# Patient Record
Sex: Male | Born: 1955 | Race: Black or African American | Hispanic: No | Marital: Single | Smoking: Never smoker
Health system: Southern US, Community
[De-identification: ages and names within clinical notes are randomized; demographics above are authoritative.]

## PROBLEM LIST (undated history)

## (undated) DIAGNOSIS — I10 Essential (primary) hypertension: Secondary | ICD-10-CM

## (undated) HISTORY — PX: NO PAST SURGERIES: SHX2092

---

## 2006-10-09 ENCOUNTER — Emergency Department (HOSPITAL_COMMUNITY): Admission: EM | Admit: 2006-10-09 | Discharge: 2006-10-09 | Payer: Self-pay | Admitting: Emergency Medicine

## 2011-03-30 ENCOUNTER — Ambulatory Visit (INDEPENDENT_AMBULATORY_CARE_PROVIDER_SITE_OTHER): Payer: No Typology Code available for payment source | Admitting: Family Medicine

## 2011-03-30 VITALS — BP 142/88 | HR 80 | Temp 98.2°F | Resp 16 | Ht 65.0 in | Wt 213.2 lb

## 2011-03-30 DIAGNOSIS — M79609 Pain in unspecified limb: Secondary | ICD-10-CM

## 2011-03-30 DIAGNOSIS — S76019A Strain of muscle, fascia and tendon of unspecified hip, initial encounter: Secondary | ICD-10-CM

## 2011-03-30 MED ORDER — PREDNISONE 20 MG PO TABS: ORAL_TABLET | ORAL | Status: DC

## 2011-03-30 NOTE — Progress Notes (Signed)
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  The pain is associated with back pain.  No weakness or fever or numbness or radiculopathy.  He has some soreness in the anterior superior iliac area.  O:  Tender anterior superior iliac crest and posterior sacroiliac joint area.  Full leg ROM with normal hip rom and norml hip crossover and no groin swelling.  A:  Groin strain   P:  Pred 20 two daily for 5 days in the morning

## 2014-10-21 ENCOUNTER — Ambulatory Visit (INDEPENDENT_AMBULATORY_CARE_PROVIDER_SITE_OTHER): Payer: Managed Care, Other (non HMO) | Admitting: Family Medicine

## 2014-10-21 VITALS — BP 154/92 | HR 50 | Temp 97.9°F | Resp 16 | Ht 65.0 in | Wt 219.0 lb

## 2014-10-21 DIAGNOSIS — M5440 Lumbago with sciatica, unspecified side: Secondary | ICD-10-CM

## 2014-10-21 DIAGNOSIS — M7711 Lateral epicondylitis, right elbow: Secondary | ICD-10-CM

## 2014-10-21 DIAGNOSIS — M129 Arthropathy, unspecified: Secondary | ICD-10-CM

## 2014-10-21 DIAGNOSIS — F524 Premature ejaculation: Secondary | ICD-10-CM

## 2014-10-21 DIAGNOSIS — M1712 Unilateral primary osteoarthritis, left knee: Secondary | ICD-10-CM

## 2014-10-21 MED ORDER — PREDNISONE 20 MG PO TABS: ORAL_TABLET | ORAL | Status: DC

## 2014-10-21 NOTE — Patient Instructions (Signed)
Tennis Elbow Your caregiver has diagnosed you with a condition often referred to as "tennis elbow." This results from small tears or soreness (inflammation) at the start (origin) of the extensor muscles of the forearm. Although the condition is often called tennis or golfer's elbow, it is caused by any repetitive action performed by your elbow. HOME CARE INSTRUCTIONS  If the condition has been short lived, rest may be the only treatment required. Using your opposite hand or arm to perform the task may help. Even changing your grip may help rest the extremity. These may even prevent the condition from recurring.  Longer standing problems, however, will often be relieved faster by:  Using anti-inflammatory agents.  Applying ice packs for 30 minutes at the end of the working day, at bed time, or when activities are finished.  Your caregiver may also have you wear a splint or sling. This will allow the inflamed tendon to heal. At times, steroid injections aided with a local anesthetic will be required along with splinting for 1 to 2 weeks. Two to three steroid injections will often solve the problem. In some long standing cases, the inflamed tendon does not respond to conservative (non-surgical) therapy. Then surgery may be required to repair it. MAKE SURE YOU:   Understand these instructions.  Will watch your condition.  Will get help right away if you are not doing well or get worse. Document Released: 01/22/2005 Document Revised: 04/16/2011 Document Reviewed: 09/10/2007 ExitCare Patient Information 2015 [employer]. This information is not intended to replace advice given to you by your health care provider. Make sure you discuss any questions you have with your health care provider.  

## 2014-10-21 NOTE — Progress Notes (Signed)
This chart was scribed for Elvina Sidle, MD by Stann Ore, medical scribe at [facility].The patient was seen in exam room 10 and the patient's care was started at 12:29 PM.  Patient ID: Curtis Reed MRN: [id number], DOB: 07-May-1955, 59 y.o. Date of Encounter: 10/21/2014  Primary Physician: No primary care provider on file.  Chief Complaint:  Chief Complaint  Patient presents with  . Arm Pain    Onset 2 weeks ago/ forearms/ new pain  . Knee Pain    Onset Chronic several years  . Back Pain    lower/ onset on and off for years    HPI:  Curtis Reed is a 59 y.o. male who presents to [facility] complaining of arm pain in his forearms that started 2 weeks ago.  He notes the pain in both forearms but more in the right than the left. He does lifting at work. He previously had similar symptoms in the past.  He also has left knee pain and intermittent dull back pain for years. When he extends his left knee, there's a crackling sound.   He also notes having premature ejaculation, but doesn't need anything until his wife comes back from [place].  He's from [place]. He works at [employer].   No past medical history on file.   Home Meds: Prior to Admission medications   Medication Sig Start Date End Date Taking? Authorizing Provider  predniSONE (DELTASONE) 20 MG tablet 2 chaques matin pour 5 jours avec petitdejeuners 03/30/11   Elvina Sidle, MD    Allergies: No Known Allergies  Social History   Social History  . Marital Status: Single    Spouse Name: N/A  . Number of Children: N/A  . Years of Education: N/A   Occupational History  . Not on file.   Social History Main Topics  . Smoking status: Never Smoker   . Smokeless tobacco: Not on file  . Alcohol Use: No  . Drug Use: No  . Sexual Activity: Yes   Other Topics Concern  . Not on file   Social History Narrative     Review of Systems: Constitutional:  negative for chills, fever, night sweats, weight changes, or fatigue  HEENT: negative for vision changes, hearing loss, congestion, rhinorrhea, ST, epistaxis, or sinus pressure Cardiovascular: negative for chest pain or palpitations Respiratory: negative for hemoptysis, wheezing, shortness of breath, or cough Abdominal: negative for abdominal pain, nausea, vomiting, diarrhea, or constipation Dermatological: negative for rash Neurologic: negative for headache, dizziness, or syncope Musc: positive for arm pain (both forearms), knee pain (left knee), back pain  GU: positive for premature ejaculation All other systems reviewed and are otherwise negative with the exception to those above and in the HPI.  Physical Exam: Blood pressure 154/92, pulse 50, temperature 97.9 F (36.6 C), temperature source Oral, resp. rate 16, height  (1.651 m), weight 219 lb (99.338 kg), SpO2 98 %., Body mass index is 36.44 kg/(m^2). General: Well developed, well nourished, in no acute distress. Head: Normocephalic, atraumatic, eyes without discharge, sclera non-icteric, nares are without discharge. Bilateral auditory canals clear, TM's are without perforation, pearly grey and translucent with reflective cone of light bilaterally. Oral cavity moist, posterior pharynx without exudate, erythema, peritonsillar abscess, or post nasal drip.  Neck: Supple. No thyromegaly. Full ROM. No lymphadenopathy. Lungs: Clear bilaterally to auscultation without wheezes, rales, or rhonchi. Breathing is unlabored. Heart: RRR with S1 S2. No murmurs, rubs, or  gallops appreciated. Abdomen: Soft, non-tender, non-distended with normoactive bowel sounds. No hepatomegaly. No rebound/guarding. No obvious abdominal masses. Msk:  Strength and tone normal for age; mild tenderness in brachioradialis with slight swelling but no ecchymosis or deformity..  He has crepitus in the left knee with good range of motion and no effusion or bony  abnormality. Extremities/Skin: Warm and dry. No clubbing or cyanosis. No edema. No rashes or suspicious lesions Neuro: Alert and oriented X 3. Moves all extremities spontaneously. Gait is normal. CNII-XII grossly in tact. Psych:  Responds to questions appropriately with a normal affect.     ASSESSMENT AND PLAN:  59 y.o. year old male with recurrent tennis elbow, chronic low back ache (mild), chronic knee crepitus with some pain and premature ejaculation This chart was scribed in my presence and reviewed by me personally.    ICD-9-CM ICD-10-CM   1. Tennis elbow syndrome, right 726.32 M77.11 predniSONE (DELTASONE) 20 MG tablet     DISCONTINUED: predniSONE (DELTASONE) 20 MG tablet  2. Midline low back pain with sciatica, sciatica laterality unspecified 724.3 M54.40 predniSONE (DELTASONE) 20 MG tablet     DISCONTINUED: predniSONE (DELTASONE) 20 MG tablet  3. Arthritis of knee, left 716.96 M12.9 predniSONE (DELTASONE) 20 MG tablet     DISCONTINUED: predniSONE (DELTASONE) 20 MG tablet  4. Premature ejaculation 302.75 F52.4      Signed, Elvina Sidle, MD    Signed, Elvina Sidle, MD 10/21/2014 12:29 PM

## 2016-08-20 ENCOUNTER — Encounter: Payer: Self-pay | Admitting: Physician Assistant

## 2016-08-20 ENCOUNTER — Ambulatory Visit (INDEPENDENT_AMBULATORY_CARE_PROVIDER_SITE_OTHER): Payer: Managed Care, Other (non HMO) | Admitting: Physician Assistant

## 2016-08-20 VITALS — BP 165/99 | HR 71 | Temp 98.1°F | Resp 16 | Ht 65.0 in | Wt 217.0 lb

## 2016-08-20 DIAGNOSIS — I10 Essential (primary) hypertension: Secondary | ICD-10-CM | POA: Diagnosis not present

## 2016-08-20 DIAGNOSIS — Z13220 Encounter for screening for lipoid disorders: Secondary | ICD-10-CM

## 2016-08-20 DIAGNOSIS — F524 Premature ejaculation: Secondary | ICD-10-CM

## 2016-08-20 DIAGNOSIS — M545 Low back pain, unspecified: Secondary | ICD-10-CM

## 2016-08-20 DIAGNOSIS — M170 Bilateral primary osteoarthritis of knee: Secondary | ICD-10-CM

## 2016-08-20 LAB — POCT GLYCOSYLATED HEMOGLOBIN (HGB A1C): Hemoglobin A1C: 5.9

## 2016-08-20 MED ORDER — MELOXICAM 15 MG PO TABS: 7.5000 mg | ORAL_TABLET | Freq: Every day | ORAL | 0 refills | Status: AC

## 2016-08-20 MED ORDER — PAROXETINE HCL 10 MG PO TABS: 5.0000 mg | ORAL_TABLET | Freq: Every day | ORAL | 3 refills | Status: DC

## 2016-08-20 MED ORDER — AMLODIPINE BESYLATE 5 MG PO TABS
5.0000 mg | ORAL_TABLET | Freq: Every day | ORAL | 0 refills | Status: DC
Start: 2016-08-20 — End: 2021-01-27

## 2016-08-20 MED ORDER — PREDNISONE 50 MG PO TABS: ORAL_TABLET | ORAL | 0 refills | Status: DC

## 2016-08-20 NOTE — Patient Instructions (Signed)
     IF you received an x-ray today, you will receive an invoice from [facility]. Please contact [facility] at [phone] with questions or concerns regarding your invoice.   IF you received labwork today, you will receive an invoice from [facility]. Please contact [facility] at [phone] with questions or concerns regarding your invoice.   Our billing staff will not be able to assist you with questions regarding bills from these companies.  You will be contacted with the lab results as soon as they are available. The fastest way to get your results is to activate your My Chart account. Instructions are located on the last page of this paperwork. If you have not heard from us regarding the results in 2 weeks, please contact this office.     

## 2016-08-20 NOTE — Progress Notes (Signed)
08/20/2016 10:26 AM   DOB: 1956/01/25 / MRN: [id number]  SUBJECTIVE:  Curtis Reed is a 61 y.o. male presenting for back pain. The pain has been present for two days now. The pain is left sided and upper lumbar. Denies radicular pain. Denies any difficulty with urination or changes in that. No sudden weight loss.   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  He was unaware that his BP was elevated.   He has No Known Allergies.   He  has no past medical history on file.    He  reports that he has never smoked. He has never used smokeless tobacco. He reports that he does not drink alcohol or use drugs. He  reports that he currently engages in sexual activity. The patient  has no past surgical history on file.  His family history is not on file.  Review of Systems  Constitutional: Negative for chills, diaphoresis and fever.  Gastrointestinal: Negative for nausea.  Skin: Negative for rash.  Neurological: Negative for dizziness.    The problem list and medications were reviewed and updated by myself where necessary and exist elsewhere in the encounter.   OBJECTIVE:  BP (!) 165/99   Pulse 71   Temp 98.1 F (36.7 C) (Oral)   Resp 16   Ht 5' 5" (1.651 m)   Wt 217 lb (98.4 kg)   SpO2 99%   BMI 36.11 kg/m   BP Readings from Last 3 Encounters:  08/20/16 (!) 165/99  10/21/14 (!) 154/92  03/30/11 (!) 142/88   Wt Readings from Last 3 Encounters:  08/20/16 217 lb (98.4 kg)  10/21/14 219 lb (99.3 kg)  03/30/11 213 lb 3.2 oz (96.7 kg)   Physical Exam  Constitutional: He is oriented to person, place, and time. He appears well-developed. He is active and cooperative.  Non-toxic appearance.  Eyes: Pupils are equal, round, and reactive to light. EOM are normal.  Cardiovascular: Normal rate.   Pulmonary/Chest: Effort normal. No tachypnea.  Musculoskeletal:       Legs: Neurological: He is alert and oriented to person, place, and time. He has normal strength and normal reflexes. He is  not disoriented. No cranial nerve deficit or sensory deficit. He exhibits normal muscle tone. Coordination and gait normal.  Skin: Skin is warm and dry. He is not diaphoretic. No pallor.  Psychiatric: His behavior is normal.  Vitals reviewed.   No results found for this or any previous visit (from the past 72 hour(s)).  No results found.  ASSESSMENT AND PLAN:  Charleton was seen today for knee pain and back pain.  Diagnoses and all orders for this visit:  Uncontrolled stage 2 hypertension -     amLODipine (NORVASC) 5 MG tablet; Take 1 tablet (5 mg total) by mouth daily. Come back in mid August 18 for BP recheck. -     POCT glycosylated hemoglobin (Hb A1C) -     CMP14+EGFR -     CBC -     TSH  Acute midline low back pain without sciatica -     meloxicam (MOBIC) 15 MG tablet; Take 0.5-1 tablets (7.5-15 mg total) by mouth daily. Take with food. Do not take Ibuprofen, Goody's, or Aleve while taking this medication.  Screening for lipid disorders -     Lipid panel  Primary osteoarthritis of both knees -     meloxicam (MOBIC) 15 MG tablet; Take 0.5-1 tablets (7.5-15 mg total) by mouth daily. Take with food.  Do not take Ibuprofen, Goody's, or Aleve while taking this medication.  Premature ejaculation, acquired, generalized, mild -     PARoxetine (PAXIL) 10 MG tablet; Take 0.5-1.5 tablets (5-15 mg total) by mouth daily. Take daily at the same time and do not miss doses.  Ideally take 1 hour before sex.    The patient is advised to call or return to clinic if he does not see an improvement in symptoms, or to seek the care of the closest emergency department if he worsens with the above plan.   Philis Fendt, MHS, PA-C [facility] 08/20/2016 10:26 AM

## 2016-08-21 LAB — CMP14+EGFR
ALK PHOS: 55 IU/L (ref 39–117)
ALT: 25 IU/L (ref 0–44)
AST: 28 IU/L (ref 0–40)
Albumin/Globulin Ratio: 1.6 (ref 1.2–2.2)
Albumin: 5 g/dL (ref 3.5–5.5)
BILIRUBIN TOTAL: 0.6 mg/dL (ref 0.0–1.2)
BUN/Creatinine Ratio: 11 (ref 9–20)
BUN: 14 mg/dL (ref 6–24)
CHLORIDE: 101 mmol/L (ref 96–106)
CO2: 18 mmol/L — AB (ref 20–29)
CREATININE: 1.31 mg/dL — AB (ref 0.76–1.27)
Calcium: 9.5 mg/dL (ref 8.7–10.2)
GFR calc Af Amer: 68 mL/min/{1.73_m2} (ref >59)
GFR calc non Af Amer: 59 mL/min/{1.73_m2} — ABNORMAL LOW (ref >59)
GLUCOSE: 84 mg/dL (ref 65–99)
Globulin, Total: 3.1 g/dL (ref 1.5–4.5)
Potassium: 4.5 mmol/L (ref 3.5–5.2)
Sodium: 141 mmol/L (ref 134–144)
Total Protein: 8.1 g/dL (ref 6.0–8.5)

## 2016-08-21 LAB — LIPID PANEL
CHOL/HDL RATIO: 4.9 ratio (ref 0.0–5.0)
CHOLESTEROL TOTAL: 212 mg/dL — AB (ref 100–199)
HDL: 43 mg/dL (ref >39)
LDL CALC: 151 mg/dL — AB (ref 0–99)
Triglycerides: 88 mg/dL (ref 0–149)
VLDL Cholesterol Cal: 18 mg/dL (ref 5–40)

## 2016-08-21 LAB — CBC
HEMATOCRIT: 47.3 % (ref 37.5–51.0)
HEMOGLOBIN: 16.3 g/dL (ref 13.0–17.7)
MCH: 30.4 pg (ref 26.6–33.0)
MCHC: 34.5 g/dL (ref 31.5–35.7)
MCV: 88 fL (ref 79–97)
Platelets: 202 10*3/uL (ref 150–379)
RBC: 5.37 x10E6/uL (ref 4.14–5.80)
RDW: 15.9 % — ABNORMAL HIGH (ref 12.3–15.4)
WBC: 6.2 10*3/uL (ref 3.4–10.8)

## 2016-08-21 LAB — TSH: TSH: 2.24 u[IU]/mL (ref 0.450–4.500)

## 2016-08-23 ENCOUNTER — Telehealth: Payer: Self-pay | Admitting: Physician Assistant

## 2016-08-23 MED ORDER — ATORVASTATIN CALCIUM 20 MG PO TABS: 20.0000 mg | ORAL_TABLET | Freq: Every day | ORAL | 3 refills | Status: DC

## 2016-08-23 NOTE — Telephone Encounter (Signed)
Call placed to make patient aware of medication, patient verbalized understanding./ S.Adynn Caseres,CMA

## 2016-08-23 NOTE — Progress Notes (Signed)
We will recheck his creatinine at follow up. Deliah BostonMichael Clark, MS, PA-C 1:35 PM, 08/23/2016

## 2016-08-23 NOTE — Telephone Encounter (Signed)
Please give him a call and make him aware that I am starting him on a cholesterol medication to protect his heart. This is waiting for him at the pharmacy. Deliah BostonMichael Clark, MS, PA-C 1:33 PM, 08/23/2016

## 2016-08-23 NOTE — Telephone Encounter (Signed)
-----   Message from [facility] M [place], [place] sent at 08/23/2016  8:29 AM EDT ----- Patient has ASCVD Risk factor of 21.3%.

## 2016-09-24 ENCOUNTER — Ambulatory Visit: Payer: Managed Care, Other (non HMO) | Admitting: Physician Assistant

## 2020-10-19 ENCOUNTER — Emergency Department (HOSPITAL_COMMUNITY)
Admission: EM | Admit: 2020-10-19 | Discharge: 2020-10-19 | Disposition: A | Discharge status: Home / Self Care | Payer: Managed Care, Other (non HMO) | Attending: Emergency Medicine | Admitting: Emergency Medicine

## 2020-10-19 ENCOUNTER — Encounter (HOSPITAL_COMMUNITY): Payer: Self-pay | Admitting: Emergency Medicine

## 2020-10-19 ENCOUNTER — Emergency Department (HOSPITAL_COMMUNITY): Payer: Managed Care, Other (non HMO)

## 2020-10-19 DIAGNOSIS — Z79899 Other long term (current) drug therapy: Secondary | ICD-10-CM | POA: Diagnosis not present

## 2020-10-19 DIAGNOSIS — D72829 Elevated white blood cell count, unspecified: Secondary | ICD-10-CM | POA: Diagnosis not present

## 2020-10-19 DIAGNOSIS — E871 Hypo-osmolality and hyponatremia: Secondary | ICD-10-CM | POA: Insufficient documentation

## 2020-10-19 DIAGNOSIS — E878 Other disorders of electrolyte and fluid balance, not elsewhere classified: Secondary | ICD-10-CM | POA: Diagnosis not present

## 2020-10-19 DIAGNOSIS — N400 Enlarged prostate without lower urinary tract symptoms: Secondary | ICD-10-CM | POA: Insufficient documentation

## 2020-10-19 DIAGNOSIS — I10 Essential (primary) hypertension: Secondary | ICD-10-CM | POA: Diagnosis not present

## 2020-10-19 DIAGNOSIS — R339 Retention of urine, unspecified: Secondary | ICD-10-CM | POA: Diagnosis present

## 2020-10-19 LAB — COMPREHENSIVE METABOLIC PANEL
ALT: 38 U/L (ref 0–44)
AST: 48 U/L — ABNORMAL HIGH (ref 15–41)
Albumin: 3.8 g/dL (ref 3.5–5.0)
Alkaline Phosphatase: 39 U/L (ref 38–126)
Anion gap: 12 (ref 5–15)
BUN: 33 mg/dL — ABNORMAL HIGH (ref 8–23)
CO2: 24 mmol/L (ref 22–32)
Calcium: 8.6 mg/dL — ABNORMAL LOW (ref 8.9–10.3)
Chloride: 94 mmol/L — ABNORMAL LOW (ref 98–111)
Creatinine, Ser: 1.78 mg/dL — ABNORMAL HIGH (ref 0.61–1.24)
GFR, Estimated: 42 mL/min — ABNORMAL LOW (ref >60)
Glucose, Bld: 134 mg/dL — ABNORMAL HIGH (ref 70–99)
Potassium: 3.7 mmol/L (ref 3.5–5.1)
Sodium: 130 mmol/L — ABNORMAL LOW (ref 135–145)
Total Bilirubin: 1.5 mg/dL — ABNORMAL HIGH (ref 0.3–1.2)
Total Protein: 7.4 g/dL (ref 6.5–8.1)

## 2020-10-19 LAB — CBC WITH DIFFERENTIAL/PLATELET
Abs Immature Granulocytes: 0.06 10*3/uL (ref 0.00–0.07)
Basophils Absolute: 0 10*3/uL (ref 0.0–0.1)
Basophils Relative: 0 %
Eosinophils Absolute: 0.1 10*3/uL (ref 0.0–0.5)
Eosinophils Relative: 1 %
HCT: 41.6 % (ref 39.0–52.0)
Hemoglobin: 14.2 g/dL (ref 13.0–17.0)
Immature Granulocytes: 0 %
Lymphocytes Relative: 9 %
Lymphs Abs: 1.3 10*3/uL (ref 0.7–4.0)
MCH: 30.4 pg (ref 26.0–34.0)
MCHC: 34.1 g/dL (ref 30.0–36.0)
MCV: 89.1 fL (ref 80.0–100.0)
Monocytes Absolute: 1.6 10*3/uL — ABNORMAL HIGH (ref 0.1–1.0)
Monocytes Relative: 12 %
Neutro Abs: 10.8 10*3/uL — ABNORMAL HIGH (ref 1.7–7.7)
Neutrophils Relative %: 78 %
Platelets: 183 10*3/uL (ref 150–400)
RBC: 4.67 MIL/uL (ref 4.22–5.81)
RDW: 13.2 % (ref 11.5–15.5)
WBC: 13.9 10*3/uL — ABNORMAL HIGH (ref 4.0–10.5)
nRBC: 0 % (ref 0.0–0.2)

## 2020-10-19 LAB — URINALYSIS, ROUTINE W REFLEX MICROSCOPIC
Bilirubin Urine: NEGATIVE
Glucose, UA: NEGATIVE mg/dL
Ketones, ur: NEGATIVE mg/dL
Leukocytes,Ua: NEGATIVE
Nitrite: NEGATIVE
Protein, ur: 100 mg/dL — AB
Specific Gravity, Urine: 1.005 (ref 1.005–1.030)
pH: 6 (ref 5.0–8.0)

## 2020-10-19 NOTE — ED Provider Notes (Signed)
Emergency Medicine Provider Triage Evaluation Note  Curtis Reed , a 65 y.o. male  was evaluated in triage.  Pt complains of hematuria, dysuria, and right-sided flank pain.  Patient reports that his symptoms started yesterday evening.  Symptoms have been present since then.  Patient endorses urinary urgency and frequency.    Review of Systems  Positive: Dysuria, hematuria, right-sided flank pain, urinary urgency, urinary frequency, Negative: Fever, chills, abdominal pain, nausea, vomiting  Physical Exam  BP (!) 165/100 (BP Location: Right Arm)   Pulse 95   Temp 99.5 F (37.5 C) (Oral)   Resp 18   SpO2 100%  Gen:   Awake, no distress   Resp:  Normal effort  MSK:   Moves extremities without difficulty  Other:  Abdomen protuberant, soft, nondistended, nontender.  Positive right CVA tenderness.  Medical Decision Making  Medically screening exam initiated at 1:37 PM.  Appropriate orders placed.  Rishawn Walck was informed that the remainder of the evaluation will be completed by another provider, this initial triage assessment does not replace that evaluation, and the importance of remaining in the ED until their evaluation is complete.  The patient appears stable so that the remainder of the work up may be completed by another provider.      Haskel Schroeder, PA-C 10/19/20 1338    Blane Ohara, MD 10/20/20 [phone]

## 2020-10-19 NOTE — ED Provider Notes (Addendum)
[facility] EMERGENCY DEPARTMENT Provider Note   CSN: [id number] Arrival date & time: 10/19/20  1232     History Chief Complaint  Patient presents with   Hematuria    Curtis Reed is a 65 y.o. male.  HPI Patient is a 65 year old male with past medical history significant for HTN states he has not been seen by urologist has no known history of BPH he is presented to the ER today with complaints of hematuria that he states noted the first noticed yesterday.  He states that he has had an issue with dribbling, urinary incontinence and retention since November 2020.  He states that he was never seen by urology for this.  He states that he has a primary care provider however cannot give me the name of his practitioner.  He denies any fevers chills nausea vomiting abdominal pain chest pain or shortness of breath but states that his abdomen feels very full.  No lightheadedness or dizziness no other associated symptoms.     History reviewed. No pertinent past medical history.  Patient Active Problem List   Diagnosis Date Noted   Uncontrolled stage 2 hypertension 08/20/2016    No past surgical history on file.     No family history on file.  Social History   Tobacco Use   Smoking status: Never   Smokeless tobacco: Never  Vaping Use   Vaping Use: Never used  Substance Use Topics   Alcohol use: No   Drug use: No    Home Medications Prior to Admission medications   Medication Sig Start Date End Date Taking? Authorizing Provider  amLODipine (NORVASC) 5 MG tablet Take 1 tablet (5 mg total) by mouth daily. Come back in mid August 18 for BP recheck. 08/20/16   Ofilia Neas, PA-C  atorvastatin (LIPITOR) 20 MG tablet Take 1 tablet (20 mg total) by mouth daily. 08/23/16   Ofilia Neas, PA-C  PARoxetine (PAXIL) 10 MG tablet Take 0.5-1.5 tablets (5-15 mg total) by mouth daily. Take daily at the same time and do not miss doses.  Ideally take 1 hour before sex.  08/20/16   Ofilia Neas, PA-C  predniSONE (DELTASONE) 50 MG tablet Take one daily for three days.  After that start taking meloxicam daily as needed for arthritis and or back pain. 08/20/16   Ofilia Neas, PA-C    Allergies    Patient has no known allergies.  Review of Systems   Review of Systems  Constitutional:  Negative for chills and fever.  HENT:  Negative for congestion.   Eyes:  Negative for pain.  Respiratory:  Negative for cough and shortness of breath.   Cardiovascular:  Negative for chest pain and leg swelling.  Gastrointestinal:  Negative for abdominal pain and vomiting.  Genitourinary:  Positive for hematuria. Negative for dysuria.  Musculoskeletal:  Negative for myalgias.  Skin:  Negative for rash.  Neurological:  Negative for dizziness and headaches.   Physical Exam Updated Vital Signs BP (!) 159/78   Pulse 71   Temp 99.5 F (37.5 C) (Oral)   Resp 17   SpO2 99%   Physical Exam Vitals and nursing note reviewed.  Constitutional:      General: He is not in acute distress.    Appearance: He is obese.     Comments: Abuse 65 year old male in no acute distress but somewhat uncomfortable appearing  HENT:     Head: Normocephalic and atraumatic.     Nose: Nose normal.  Eyes:     General: No scleral icterus. Cardiovascular:     Rate and Rhythm: Normal rate and regular rhythm.     Pulses: Normal pulses.     Heart sounds: Normal heart sounds.  Pulmonary:     Effort: Pulmonary effort is normal. No respiratory distress.     Breath sounds: No wheezing.  Abdominal:     Palpations: Abdomen is soft.     Tenderness: There is abdominal tenderness. There is no guarding or rebound.     Comments: Some suprapubic tenderness  Musculoskeletal:     Cervical back: Normal range of motion.     Right lower leg: No edema.     Left lower leg: No edema.  Skin:    General: Skin is warm and dry.     Capillary Refill: Capillary refill takes less than 2 seconds.  Neurological:      Mental Status: He is alert. Mental status is at baseline.  Psychiatric:        Mood and Affect: Mood normal.        Behavior: Behavior normal.    ED Results / Procedures / Treatments   Labs (all labs ordered are listed, but only abnormal results are displayed) Labs Reviewed  URINALYSIS, ROUTINE W REFLEX MICROSCOPIC - Abnormal; Notable for the following components:      Result Value   Color, Urine AMBER (*)    APPearance HAZY (*)    Hgb urine dipstick MODERATE (*)    Protein, ur 100 (*)    Bacteria, UA RARE (*)    All other components within normal limits  CBC WITH DIFFERENTIAL/PLATELET - Abnormal; Notable for the following components:   WBC 13.9 (*)    Neutro Abs 10.8 (*)    Monocytes Absolute 1.6 (*)    All other components within normal limits  COMPREHENSIVE METABOLIC PANEL - Abnormal; Notable for the following components:   Sodium 130 (*)    Chloride 94 (*)    Glucose, Bld 134 (*)    BUN 33 (*)    Creatinine, Ser 1.78 (*)    Calcium 8.6 (*)    AST 48 (*)    Total Bilirubin 1.5 (*)    GFR, Estimated 42 (*)    All other components within normal limits  URINE CULTURE    EKG None  Radiology CT Renal Stone Study  Result Date: 10/19/2020 CLINICAL DATA:  Flank pain, hematuria EXAM: CT ABDOMEN AND PELVIS WITHOUT CONTRAST TECHNIQUE: Multidetector CT imaging of the abdomen and pelvis was performed following the standard protocol without IV contrast. COMPARISON:  None. FINDINGS: Lower chest: The lung bases are clear. The imaged heart is unremarkable. Hepatobiliary: The liver and gallbladder are unremarkable. There is no biliary ductal dilatation. Pancreas: Unremarkable. Spleen: Unremarkable. Adrenals/Urinary Tract: The adrenals are unremarkable. The bladder is markedly distended due to a markedly enlarged prostate. There is mild to moderate bilateral hydroureter and mild hydronephrosis. A fluid density structure abutting the anterior margin of the left psoas muscle and  posterior aspect of the bladder is favored to reflect a tortuous left ureter. No focal lesions or calculi are seen in either kidney or along the course of either ureter. Stomach/Bowel: The stomach is unremarkable. There is no evidence of bowel obstruction. There is no abnormal bowel wall thickening or inflammatory change. The appendix is unremarkable. Vascular/Lymphatic: There is calcified atherosclerotic plaque in the bilateral iliac vessels. The aorta is nonaneurysmal. There is no abdominal or pelvic lymphadenopathy. Reproductive: The prostate is markedly enlarged  measuring up to 7.8 cm TV x 7.3 cm AP by 7.3 cm cc yielding a calculated volume of 272 cc Other: There is scattered small volume free fluid in the pericolic gutters. There is no free intraperitoneal air. Musculoskeletal: There is mild multilevel degenerative change of the spine. There is no acute osseous abnormality or aggressive osseous lesion. 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 No stone is identified. Electronically Signed   By: Lesia Hausen M.D.   On: 10/19/2020 14:53    Procedures Procedures   Medications Ordered in ED Medications - No data to display  ED Course  I have reviewed the triage vital signs and the nursing notes.  Pertinent labs & imaging results that were available during my care of the patient were reviewed by me and considered in my medical decision making (see chart for details).    MDM Rules/Calculators/A&P                           Patient is 65 year old gentleman seems to not have a PCP here today with some abdominal pressure and discomfort hematuria for few days before this seems to have had urinary hesitancy and dribbling for the past 2 years.  No infectious symptoms no fevers chills CMP is notable for mild hyponatremia mild hypochloremia BUN is marginally elevated  from his baseline at 1.3 today with creatinine of 1.78 BUN is elevated consistent with post renal/obstructive uropathy.  Patient had immediate improvement in his symptoms after Foley was placed just short of 1 L of bloody urine output  CBC with mild leukocytosis patient denies any fevers or chills he is well-appearing afebrile no tachycardia blood pressure moderately elevated urinalysis with rare bacteria we will culture urine  Ultimately patient will be discharged home with Foley catheter in place lengthy discussion about the importance of close follow-up with PCP to recheck kidney function if not able to follow-up will need to return to ER in 2 or 3 days for kidney function recheck.  We will follow-up with urology.  I provide patient with information for their office and request that he call today to make an appointment as soon as possible.  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 No stone is identified. Electronically Signed   By: Lesia Hausen M.D.   On: 10/19/2020 14:53    Return precautions given.  Patient discharged with Foley catheter in place we will hold off on prescribing antibiotics given unremarkable urine appearance Culture sent and pending.    Final Clinical Impression(s) / ED Diagnoses Final diagnoses:  Urinary retention  Enlarged prostate    Rx / DC Orders ED Discharge Orders     None        Gailen Shelter, [place] 10/20/20 1738    Gailen Shelter, [place] 10/20/20 1741    Gerhard Munch, MD 10/20/20 2330

## 2020-10-19 NOTE — Discharge Instructions (Addendum)
You had a Foley catheter placed today and were found to have a good amount of blood in your urine.  You will need to follow-up with urology.  They may need to do further evaluations of you for your blood in urine/hematuria.  This may or may not include using a camera to inspect your bladder.  Please take your prescribed medications as prescribed.  Please leave the Foley in place until you are seen by urology.  You may always return to the ER for any new or concerning symptoms.  Your prostate is quite large I suspect that this is causing the obstruction of your urinary canal.

## 2020-10-19 NOTE — ED Notes (Signed)
Foley insert and of dark brown urine obtained.

## 2020-10-19 NOTE — ED Triage Notes (Signed)
Patient complains of hematuria that started yesterday. States he has had a problem with intermittent urinary incontinence and retention since November 2020.

## 2020-10-20 LAB — URINE CULTURE: Culture: NO GROWTH

## 2020-10-28 ENCOUNTER — Other Ambulatory Visit: Payer: Self-pay

## 2020-10-28 ENCOUNTER — Emergency Department (HOSPITAL_COMMUNITY)
Admission: EM | Admit: 2020-10-28 | Discharge: 2020-10-28 | Disposition: A | Discharge status: Home / Self Care | Payer: Managed Care, Other (non HMO) | Attending: Emergency Medicine | Admitting: Emergency Medicine

## 2020-10-28 ENCOUNTER — Encounter (HOSPITAL_COMMUNITY): Payer: Self-pay

## 2020-10-28 DIAGNOSIS — R319 Hematuria, unspecified: Secondary | ICD-10-CM | POA: Diagnosis present

## 2020-10-28 DIAGNOSIS — R310 Gross hematuria: Secondary | ICD-10-CM

## 2020-10-28 LAB — CBC
HCT: 39 % (ref 39.0–52.0)
Hemoglobin: 13.3 g/dL (ref 13.0–17.0)
MCH: 30.9 pg (ref 26.0–34.0)
MCHC: 34.1 g/dL (ref 30.0–36.0)
MCV: 90.7 fL (ref 80.0–100.0)
Platelets: 311 10*3/uL (ref 150–400)
RBC: 4.3 MIL/uL (ref 4.22–5.81)
RDW: 13.2 % (ref 11.5–15.5)
WBC: 7.5 10*3/uL (ref 4.0–10.5)
nRBC: 0 % (ref 0.0–0.2)

## 2020-10-28 LAB — BASIC METABOLIC PANEL
Anion gap: 6 (ref 5–15)
BUN: 9 mg/dL (ref 8–23)
CO2: 25 mmol/L (ref 22–32)
Calcium: 8.4 mg/dL — ABNORMAL LOW (ref 8.9–10.3)
Chloride: 104 mmol/L (ref 98–111)
Creatinine, Ser: 1.12 mg/dL (ref 0.61–1.24)
GFR, Estimated: >60 mL/min (ref >60)
Glucose, Bld: 162 mg/dL — ABNORMAL HIGH (ref 70–99)
Potassium: 3.2 mmol/L — ABNORMAL LOW (ref 3.5–5.1)
Sodium: 135 mmol/L (ref 135–145)

## 2020-10-28 LAB — URINALYSIS, MICROSCOPIC (REFLEX)
RBC / HPF: >50 RBC/hpf (ref 0–5)
Squamous Epithelial / HPF: NONE SEEN (ref 0–5)

## 2020-10-28 LAB — URINALYSIS, ROUTINE W REFLEX MICROSCOPIC

## 2020-10-28 NOTE — ED Triage Notes (Signed)
Pt c.o hematuria since yesterday. Seen about 2 weeks ago for urinary retention and had urinary catheter placed. Leg bag currently filled with dark red urine. Pt denies any pain, appointment scheduled with urologist next week.

## 2020-10-28 NOTE — ED Provider Notes (Signed)
[facility] EMERGENCY DEPARTMENT Provider Note   CSN: [id number] Arrival date & time: 10/28/20  1508     History Chief Complaint  Patient presents with   Hematuria    Curtis Reed is a 65 y.o. male.  65 year old male who had a urinary catheter placed about a week ago the presents emerged from today for hematuria.  Patient states that he did not have any issues until yesterday.  He states that he feels little bit of rubbing and scratching on his thigh and in his penis and in his bladder.  No obvious trauma that preceded the event.  He has no pain elsewhere.  He has no back pain.  No fever, nausea, vomiting.  States that he is on 2 medicines once an antibiotic and it sounds like the other is Flomax.  His scheduled to see urology again in a about a week.   Hematuria      History reviewed. No pertinent past medical history.  Patient Active Problem List   Diagnosis Date Noted   Uncontrolled stage 2 hypertension 08/20/2016    History reviewed. No pertinent surgical history.     No family history on file.  Social History   Tobacco Use   Smoking status: Never   Smokeless tobacco: Never  Vaping Use   Vaping Use: Never used  Substance Use Topics   Alcohol use: No   Drug use: No    Home Medications Prior to Admission medications   Medication Sig Start Date End Date Taking? Authorizing Provider  amLODipine (NORVASC) 5 MG tablet Take 1 tablet (5 mg total) by mouth daily. Come back in mid August 18 for BP recheck. 08/20/16   Ofilia Neas, PA-C  atorvastatin (LIPITOR) 20 MG tablet Take 1 tablet (20 mg total) by mouth daily. 08/23/16   Ofilia Neas, PA-C  PARoxetine (PAXIL) 10 MG tablet Take 0.5-1.5 tablets (5-15 mg total) by mouth daily. Take daily at the same time and do not miss doses.  Ideally take 1 hour before sex. 08/20/16   Ofilia Neas, PA-C  predniSONE (DELTASONE) 50 MG tablet Take one daily for three days.  After that start taking meloxicam  daily as needed for arthritis and or back pain. 08/20/16   Ofilia Neas, PA-C    Allergies    Patient has no known allergies.  Review of Systems   Review of Systems  Genitourinary:  Positive for hematuria.  All other systems reviewed and are negative.  Physical Exam Updated Vital Signs BP 136/80   Pulse (!) 54   Temp 98 F (36.7 C)   Resp 16   Ht 5\' 5"  (1.651 m)   Wt 98.4 kg   SpO2 100%   BMI 36.10 kg/m   Physical Exam Vitals and nursing note reviewed.  Constitutional:      Appearance: He is well-developed.  HENT:     Head: Normocephalic and atraumatic.     Nose: No congestion or rhinorrhea.  Eyes:     Pupils: Pupils are equal, round, and reactive to light.  Cardiovascular:     Rate and Rhythm: Normal rate.  Pulmonary:     Effort: Pulmonary effort is normal. No respiratory distress.  Abdominal:     General: Abdomen is flat. There is no distension.     Tenderness: There is no abdominal tenderness.     Comments: Leg bag in place with pretty dark hematuria present. Catheter is taught.   Musculoskeletal:  General: Normal range of motion.     Cervical back: Normal range of motion.  Skin:    General: Skin is warm and dry.  Neurological:     General: No focal deficit present.     Mental Status: He is alert.    ED Results / Procedures / Treatments   Labs (all labs ordered are listed, but only abnormal results are displayed) Labs Reviewed  BASIC METABOLIC PANEL - Abnormal; Notable for the following components:      Result Value   Potassium 3.2 (*)    Glucose, Bld 162 (*)    Calcium 8.4 (*)    All other components within normal limits  URINALYSIS, ROUTINE W REFLEX MICROSCOPIC - Abnormal; Notable for the following components:   Color, Urine RED (*)    APPearance TURBID (*)    Glucose, UA   (*)    Value: TEST NOT REPORTED DUE TO COLOR INTERFERENCE OF URINE PIGMENT   Hgb urine dipstick   (*)    Value: TEST NOT REPORTED DUE TO COLOR INTERFERENCE OF URINE  PIGMENT   Bilirubin Urine   (*)    Value: TEST NOT REPORTED DUE TO COLOR INTERFERENCE OF URINE PIGMENT   Ketones, ur   (*)    Value: TEST NOT REPORTED DUE TO COLOR INTERFERENCE OF URINE PIGMENT   Protein, ur   (*)    Value: TEST NOT REPORTED DUE TO COLOR INTERFERENCE OF URINE PIGMENT   Nitrite   (*)    Value: TEST NOT REPORTED DUE TO COLOR INTERFERENCE OF URINE PIGMENT   Leukocytes,Ua   (*)    Value: TEST NOT REPORTED DUE TO COLOR INTERFERENCE OF URINE PIGMENT   All other components within normal limits  URINALYSIS, MICROSCOPIC (REFLEX) - Abnormal; Notable for the following components:   Bacteria, UA MANY (*)    All other components within normal limits  CBC    EKG None  Radiology No results found.  Procedures Procedures   Medications Ordered in ED Medications - No data to display  ED Course  I have reviewed the triage vital signs and the nursing notes.  Pertinent labs & imaging results that were available during my care of the patient were reviewed by me and considered in my medical decision making (see chart for details).    MDM Rules/Calculators/A&P                         Patient's urine is difficult to interpret with amount of hematuria.  He does have more bacteria than previously however he is currently on an antibiotic.  We will send it for culture.  I suspect he probably has some type of trauma or just from being too tight and he is having some hematuria from that.  Either way the catheter still draining normally and he has an appoint with urology in a week.  I discussed that if he had any stoppage of urine to return here immediately.  Otherwise we will adjust his attachment point for his Foley in hopes that it gives him a little bit more leeway and I discussed the bleeding to stop in a couple days.  Final Clinical Impression(s) / ED Diagnoses Final diagnoses:  None    Rx / DC Orders ED Discharge Orders     None        Tysheem Accardo, Barbara Cower, MD 10/28/20 2304

## 2020-10-28 NOTE — ED Provider Notes (Signed)
Emergency Medicine Provider Triage Evaluation Note  Curtis Reed , a 65 y.o. male  was evaluated in triage.  Pt complains of hematuria.  Symptoms began yesterday.  He was seen here about 2 weeks ago for urinary retention, abdominal pain, headache catheter placed.  Patient states that his urine was very dark when he first came to the ER on 9/14.  He had a CT scan which showed a markedly enlarged prostate, he had a Foley placed and was told to follow-up with his PCP.  Patient states he has a urology appointment on Monday.  He states that his urine has become more clear but yesterday began more bloody.  He denies any abdominal back, penile pain. Review of Systems  Positive: As above Negative: As above  Physical Exam  BP (!) 158/91 (BP Location: Left Arm)   Pulse 80   Temp 98 F (36.7 C)   Resp 18   Ht 5\' 5"  (1.651 m)   Wt 98.4 kg   SpO2 100%   BMI 36.10 kg/m  Gen:   Awake, no distress   Resp:  Normal effort  MSK:   Moves extremities without difficulty  Other:  GU exam with no visible bleeding around the urinary catheter, scrotum soft and nontender, abdomen is soft and nontender  Medical Decision Making  Medically screening exam initiated at 3:32 PM.  Appropriate orders placed.  Curtis Reed was informed that the remainder of the evaluation will be completed by another provider, this initial triage assessment does not replace that evaluation, and the importance of remaining in the ED until their evaluation is complete.     Ernie Hew, PA-C 10/28/20 1535    10/30/20 P, DO 10/30/20 1152

## 2020-10-31 LAB — URINE CULTURE: Culture: >=100000 — AB

## 2020-11-01 ENCOUNTER — Telehealth: Payer: Self-pay

## 2020-11-01 NOTE — Telephone Encounter (Signed)
Post ED Visit - Positive Culture Follow-up  Culture report reviewed by antimicrobial stewardship pharmacist: [facility] [x]  , Pharm.D. []  Filbert Schilder, Pharm.D., BCPS AQ-ID []  , Pharm.D., BCPS []  Celedonio Miyamoto, Pharm.D., BCPS []  [place], Garvin Fila.D., BCPS, AAHIVP []  , Pharm.D., BCPS, AAHIVP []  Georgina Pillion, PharmD, BCPS []  , PharmD, BCPS []  [place], PharmD, BCPS []  [place], PharmD []  , PharmD, BCPS []  Estella Husk, PharmD  Pharmacy Team []  Lysle Pearl, PharmD []  , PharmD []  Phillips Climes, PharmD []  , Rph []  Agapito Games) , PharmD []  Verlan Friends, PharmD []  , PharmD []  Mervyn Gay, PharmD []  , PharmD []  Vinnie Level, PharmD []  Wonda Olds, PharmD []  , PharmD []  Len Childs, PharmD   Positive urine culture Reviewed by ED provider: , PA-C  Confirmed with patient that he has a follow-up appointment with his urologist on Friday, 11/04/2020 at 10:45 am.   no further patient follow-up is required at this time.  11/01/2020, 9:15 AM

## 2020-11-01 NOTE — Progress Notes (Signed)
ED Antimicrobial Stewardship Positive Culture Follow Up   Curtis Reed is an 65 y.o. male who presented to [facility] on 10/28/2020 with a chief complaint of  Chief Complaint  Patient presents with   Hematuria    Recent Results (from the past 720 hour(s))  Urine Culture     Status: None   Collection Time: 10/19/20  1:35 PM   Specimen: Urine, Clean Catch  Result Value Ref Range Status   Specimen Description URINE, CLEAN CATCH  Final   Special Requests NONE  Final   Culture   Final    NO GROWTH Performed at [facility], [address] [place], [place] [address]    Report Status 10/20/2020 FINAL  Final  Urine Culture     Status: Abnormal   Collection Time: 10/28/20  8:00 PM   Specimen: Urine, Catheterized  Result Value Ref Range Status   Specimen Description URINE, CATHETERIZED  Final   Special Requests   Final    NONE Performed at [facility], [address], [place], [place] [address]    Culture >=100,000 COLONIES/mL KLEBSIELLA OXYTOCA (A)  Final   Report Status 10/31/2020 FINAL  Final   Organism ID, Bacteria KLEBSIELLA OXYTOCA (A)  Final      Susceptibility   Klebsiella oxytoca - MIC*    AMPICILLIN RESISTANT Resistant     CEFAZOLIN <=4 SENSITIVE Sensitive     CEFEPIME <=0.12 SENSITIVE Sensitive     CEFTRIAXONE <=0.25 SENSITIVE Sensitive     CIPROFLOXACIN <=0.25 SENSITIVE Sensitive     GENTAMICIN <=1 SENSITIVE Sensitive     IMIPENEM <=0.25 SENSITIVE Sensitive     NITROFURANTOIN <=16 SENSITIVE Sensitive     TRIMETH/SULFA <=20 SENSITIVE Sensitive     AMPICILLIN/SULBACTAM 4 SENSITIVE Sensitive     PIP/TAZO <=4 SENSITIVE Sensitive     * >=100,000 COLONIES/mL KLEBSIELLA OXYTOCA    65 yo male presents with hematuria and urinary retention in the setting of indwelling catheter. UA significant for WBC. Not discharged with antibiotics since he was scheduled to see urology in the next few days.  Nursing to call and confirm that patient has urology follow-up and  symptoms have resolved.   ED Provider: Fayrene Helper, PA-C   [name] 11/01/2020, 7:48 AM Clinical Pharmacist Monday - Friday phone -  [phone] Saturday - Sunday phone - [phone]

## 2020-11-07 ENCOUNTER — Emergency Department (HOSPITAL_COMMUNITY)
Admission: EM | Admit: 2020-11-07 | Discharge: 2020-11-08 | Disposition: A | Discharge status: Home / Self Care | Payer: Managed Care, Other (non HMO) | Attending: Emergency Medicine | Admitting: Emergency Medicine

## 2020-11-07 DIAGNOSIS — T839XXA Unspecified complication of genitourinary prosthetic device, implant and graft, initial encounter: Secondary | ICD-10-CM

## 2020-11-07 DIAGNOSIS — T83098A Other mechanical complication of other indwelling urethral catheter, initial encounter: Secondary | ICD-10-CM | POA: Diagnosis present

## 2020-11-07 DIAGNOSIS — I10 Essential (primary) hypertension: Secondary | ICD-10-CM | POA: Diagnosis not present

## 2020-11-07 DIAGNOSIS — Z79899 Other long term (current) drug therapy: Secondary | ICD-10-CM | POA: Diagnosis not present

## 2020-11-07 DIAGNOSIS — R1030 Lower abdominal pain, unspecified: Secondary | ICD-10-CM | POA: Insufficient documentation

## 2020-11-07 LAB — URINALYSIS, ROUTINE W REFLEX MICROSCOPIC
Bilirubin Urine: NEGATIVE
Glucose, UA: NEGATIVE mg/dL
Ketones, ur: 20 mg/dL — AB
Nitrite: NEGATIVE
Protein, ur: 100 mg/dL — AB
RBC / HPF: >50 RBC/hpf — ABNORMAL HIGH (ref 0–5)
Specific Gravity, Urine: 1.018 (ref 1.005–1.030)
pH: 6 (ref 5.0–8.0)

## 2020-11-07 MED ORDER — CEPHALEXIN 250 MG PO CAPS
500.0000 mg | ORAL_CAPSULE | Freq: Once | ORAL | Status: AC
  Administered 2020-11-07: 500 mg via ORAL
  Filled 2020-11-07: qty 2

## 2020-11-07 MED ORDER — OXYCODONE-ACETAMINOPHEN 5-325 MG PO TABS
1.0000 | ORAL_TABLET | Freq: Once | ORAL | Status: AC
  Administered 2020-11-07: 1 via ORAL
  Filled 2020-11-07: qty 1

## 2020-11-07 MED ORDER — LIDOCAINE HCL URETHRAL/MUCOSAL 2 % EX GEL
1.0000 "application " | Freq: Once | CUTANEOUS | Status: AC
  Administered 2020-11-07: 1 via URETHRAL
  Filled 2020-11-07: qty 11

## 2020-11-07 NOTE — ED Provider Notes (Signed)
[facility] EMERGENCY DEPARTMENT Provider Note   CSN: [id number] Arrival date & time: 11/07/20  1120     History Chief Complaint  Patient presents with   Groin Pain    Curtis Reed is a 65 y.o. male.  The history is provided by the patient and medical records.  Groin Pain  65 y.o. M here with pain related to his foley catheter.  Patient seen in the ED last month, had foley placed in the ED.  This was exchanged on Friday in urology office and states since that time he has had ongoing pain along tip of his penis.  He states there has been fluids draining around the foley itself which is new.  Foley has continued draining as normal.  No fever/chills/sweats.  States foley is supposed to stay in place until 12-26-20.  States he is having  "surgery" then.  No past medical history on file.  Patient Active Problem List   Diagnosis Date Noted   Uncontrolled stage 2 hypertension 08/20/2016    No past surgical history on file.     No family history on file.  Social History   Tobacco Use   Smoking status: Never   Smokeless tobacco: Never  Vaping Use   Vaping Use: Never used  Substance Use Topics   Alcohol use: No   Drug use: No    Home Medications Prior to Admission medications   Medication Sig Start Date End Date Taking? Authorizing Provider  cephALEXin (KEFLEX) 500 MG capsule Take 1 capsule (500 mg total) by mouth 3 (three) times daily. 11/08/20  Yes Garlon Hatchet, PA-C  amLODipine (NORVASC) 5 MG tablet Take 1 tablet (5 mg total) by mouth daily. Come back in mid August 18 for BP recheck. 08/20/16   Ofilia Neas, PA-C  atorvastatin (LIPITOR) 20 MG tablet Take 1 tablet (20 mg total) by mouth daily. 08/23/16   Ofilia Neas, PA-C  PARoxetine (PAXIL) 10 MG tablet Take 0.5-1.5 tablets (5-15 mg total) by mouth daily. Take daily at the same time and do not miss doses.  Ideally take 1 hour before sex. 08/20/16   Ofilia Neas, PA-C  predniSONE (DELTASONE)  50 MG tablet Take one daily for three days.  After that start taking meloxicam daily as needed for arthritis and or back pain. 08/20/16   Ofilia Neas, PA-C    Allergies    Patient has no known allergies.  Review of Systems   Review of Systems  Genitourinary:  Positive for penile pain.  All other systems reviewed and are negative.  Physical Exam Updated Vital Signs BP (!) 150/85   Pulse 99   Temp 98.8 F (37.1 C) (Oral)   Resp 20   SpO2 98%   Physical Exam Vitals and nursing note reviewed.  Constitutional:      Appearance: He is well-developed.  HENT:     Head: Normocephalic and atraumatic.  Eyes:     Conjunctiva/sclera: Conjunctivae normal.     Pupils: Pupils are equal, round, and reactive to light.  Cardiovascular:     Rate and Rhythm: Normal rate and regular rhythm.     Heart sounds: Normal heart sounds.  Pulmonary:     Effort: Pulmonary effort is normal.     Breath sounds: Normal breath sounds.  Abdominal:     General: Bowel sounds are normal.     Palpations: Abdomen is soft.  Genitourinary:    Comments: Exam chaperoned by RN Foley catheter in place,  there is some purulent drainage noted around catheter tubing at urethral meatus, signs of irritation present without focal ulceration Musculoskeletal:        General: Normal range of motion.     Cervical back: Normal range of motion.  Skin:    General: Skin is warm and dry.  Neurological:     Mental Status: He is alert and oriented to person, place, and time.    ED Results / Procedures / Treatments   Labs (all labs ordered are listed, but only abnormal results are displayed) Labs Reviewed  URINALYSIS, ROUTINE W REFLEX MICROSCOPIC - Abnormal; Notable for the following components:      Result Value   APPearance HAZY (*)    Hgb urine dipstick MODERATE (*)    Ketones, ur 20 (*)    Protein, ur 100 (*)    Leukocytes,Ua LARGE (*)    RBC / HPF >50 (*)    Bacteria, UA FEW (*)    All other components within  normal limits  URINE CULTURE    EKG None  Radiology No results found.  Procedures Procedures   Medications Ordered in ED Medications  oxyCODONE-acetaminophen (PERCOCET/ROXICET) 5-325 MG per tablet 1 tablet (1 tablet Oral Given 11/07/20 2008)  lidocaine (XYLOCAINE) 2 % jelly 1 application (1 application Urethral Given 11/07/20 2334)  cephALEXin (KEFLEX) capsule 500 mg (500 mg Oral Given 11/07/20 2333)    ED Course  I have reviewed the triage vital signs and the nursing notes.  Pertinent labs & imaging results that were available during my care of the patient were reviewed by me and considered in my medical decision making (see chart for details).    MDM Rules/Calculators/A&P                           65 year old male presenting to the ED with pain from his Foley catheter.  Reports this was exchanged in urology clinic on Friday and since then has had a lot of burning and irritation along his urethra.  He is afebrile and nontoxic.  Exam is chaperoned by RN, does appear to have some purulent drainage from the meatus around the Foley tubing.  There is some signs of irritation but no ulceration.  Foley is freely draining.  We will exchange Foley catheter, will use 1 size smaller to see if this helps.  UA is grossly infected, culture pending.  Will start on Keflex.  12:17 AM Patient states 16F catheter is feeling a lot better.  Foley is freely draining.  Urine culture sent.  Will discharge home with keflex.  Close follow-up with urology.  Return here for new concerns.  Final Clinical Impression(s) / ED Diagnoses Final diagnoses:  Foley catheter problem, initial encounter [facility])    Rx / DC Orders ED Discharge Orders          Ordered    cephALEXin (KEFLEX) 500 MG capsule  3 times daily        11/08/20 0010             Garlon Hatchet, PA-C 11/08/20 0256    Eber Hong, MD 11/09/20 [phone]

## 2020-11-07 NOTE — ED Notes (Signed)
X2 with no response.  

## 2020-11-07 NOTE — ED Triage Notes (Addendum)
Patient here for evaluation of groin pain, states he had is urinary catheter replaced yesterday but states he is still having difficulty urinating and doing so causes him pain at the tip of his penis.

## 2020-11-07 NOTE — ED Notes (Signed)
Patient seen entering ED again. Advised patient to let staff know if he is stepping outside for a few minutes. States he went to get food.

## 2020-11-07 NOTE — ED Provider Notes (Signed)
Emergency Medicine Provider Triage Evaluation Note  Ho Parisi , a 65 y.o. male  was evaluated in triage.  Pt complains of pain at the tip of his penis.  He has an indwelling F[facility] catheter, [facility] of last week.  Urine continues to drain, yellow in color.  No fevers or vomiting.  Review of Systems  Positive: Penile pain Negative: Hematuria  Physical Exam  BP 113/70 (BP Location: Left Arm)   Pulse 76   Temp 98.8 F (37.1 C) (Oral)   Resp 17   SpO2 100%  Gen:   Awake, no distress   Resp:  Normal effort  MSK:   Moves extremities without difficulty  Other:    Medical Decision Making  Medically screening exam initiated at 12:38 PM.  Appropriate orders placed.  Shann Merrick was informed that the remainder of the evaluation will be completed by another provider, this initial triage assessment does not replace that evaluation, and the importance of remaining in the ED until their evaluation is complete.     Renne Crigler, PA-C 11/07/20 1239    Bethann Berkshire, MD 11/12/20 1021

## 2020-11-07 NOTE — ED Notes (Signed)
X1 for room assignment with no response 

## 2020-11-08 MED ORDER — CEPHALEXIN 500 MG PO CAPS: 500.0000 mg | ORAL_CAPSULE | Freq: Three times a day (TID) | ORAL | 0 refills | Status: DC

## 2020-11-08 NOTE — Discharge Instructions (Signed)
Continue the oral antibiotics.  Make sure foley catheter is not being tugged on as this can cause irritation. Follow-up with urology. Return here for new concerns.

## 2020-11-08 NOTE — ED Notes (Signed)
Foley changed to leg bag and pt given instructions on use. Pt denies any complaints of pain at insertion site. No acute changes noted. Will continue to monitor.

## 2020-11-10 LAB — URINE CULTURE: Culture: 50000 — AB

## 2020-11-11 ENCOUNTER — Telehealth: Payer: Self-pay | Admitting: *Deleted

## 2020-11-11 NOTE — Progress Notes (Signed)
ED Antimicrobial Stewardship Positive Culture Follow Up   Curtis Reed is an 65 y.o. male who presented to [facility] on 11/07/2020 with a chief complaint of  Chief Complaint  Patient presents with   Groin Pain    Recent Results (from the past 720 hour(s))  Urine Culture     Status: None   Collection Time: 10/19/20  1:35 PM   Specimen: Urine, Clean Catch  Result Value Ref Range Status   Specimen Description URINE, CLEAN CATCH  Final   Special Requests NONE  Final   Culture   Final    NO GROWTH Performed at [facility], [address], [place], [place] [address]    Report Status 10/20/2020 FINAL  Final  Urine Culture     Status: Abnormal   Collection Time: 10/28/20  8:00 PM   Specimen: Urine, Catheterized  Result Value Ref Range Status   Specimen Description URINE, CATHETERIZED  Final   Special Requests   Final    NONE Performed at [facility], [address] [place], [place] [address]    Culture >=100,000 COLONIES/mL KLEBSIELLA OXYTOCA (A)  Final   Report Status 10/31/2020 FINAL  Final   Organism ID, Bacteria KLEBSIELLA OXYTOCA (A)  Final      Susceptibility   Klebsiella oxytoca - MIC*    AMPICILLIN RESISTANT Resistant     CEFAZOLIN <=4 SENSITIVE Sensitive     CEFEPIME <=0.12 SENSITIVE Sensitive     CEFTRIAXONE <=0.25 SENSITIVE Sensitive     CIPROFLOXACIN <=0.25 SENSITIVE Sensitive     GENTAMICIN <=1 SENSITIVE Sensitive     IMIPENEM <=0.25 SENSITIVE Sensitive     NITROFURANTOIN <=16 SENSITIVE Sensitive     TRIMETH/SULFA <=20 SENSITIVE Sensitive     AMPICILLIN/SULBACTAM 4 SENSITIVE Sensitive     PIP/TAZO <=4 SENSITIVE Sensitive     * >=100,000 COLONIES/mL KLEBSIELLA OXYTOCA  Urine Culture     Status: Abnormal   Collection Time: 11/07/20 11:37 PM   Specimen: Urine, Catheterized  Result Value Ref Range Status   Specimen Description URINE, CATHETERIZED  Final   Special Requests   Final    NONE Performed at [facility], [address],  [place], [place] [address]    Culture (A)  Final    50,000 COLONIES/mL PSEUDOMONAS AERUGINOSA 20,000 COLONIES/mL ENTEROBACTER CLOACAE    Report Status 11/10/2020 FINAL  Final   Organism ID, Bacteria PSEUDOMONAS AERUGINOSA (A)  Final   Organism ID, Bacteria ENTEROBACTER CLOACAE (A)  Final      Susceptibility   Enterobacter cloacae - MIC*    CEFAZOLIN RESISTANT Resistant     CEFEPIME <=0.12 SENSITIVE Sensitive     CIPROFLOXACIN <=0.25 SENSITIVE Sensitive     GENTAMICIN <=1 SENSITIVE Sensitive     IMIPENEM <=0.25 SENSITIVE Sensitive     NITROFURANTOIN 32 SENSITIVE Sensitive     TRIMETH/SULFA <=20 SENSITIVE Sensitive     PIP/TAZO <=4 SENSITIVE Sensitive     * 20,000 COLONIES/mL ENTEROBACTER CLOACAE   Pseudomonas aeruginosa - MIC*    CEFTAZIDIME 2 SENSITIVE Sensitive     CIPROFLOXACIN <=0.25 SENSITIVE Sensitive     GENTAMICIN <=1 SENSITIVE Sensitive     IMIPENEM 2 SENSITIVE Sensitive     PIP/TAZO 8 SENSITIVE Sensitive     CEFEPIME 2 SENSITIVE Sensitive     * 50,000 COLONIES/mL PSEUDOMONAS AERUGINOSA   [x]  Treated with Keflex, organism resistant to prescribed antimicrobial []  Patient discharged originally without antimicrobial agent and treatment is now indicated  New  antibiotic prescription: Foley catether was exchanged in the ED so likely colonized with organism too, Keflex is not covering the organisms either so discontinue Keflex. No new abx recommended at this time. Make sure has urology follow up scheduled.  ED Provider: Parke Poisson, PA-C  Loleta Dicker, PharmD, [facility] Emergency Medicine Clinical Pharmacist ED RPh Phone: [phone] Main RX: [phone]

## 2020-11-11 NOTE — Telephone Encounter (Signed)
Post ED Visit - Positive Culture Follow-up  Culture report reviewed by antimicrobial stewardship pharmacist: [facility] []  , Pharm.D. []  Enzo Bi, Pharm.D., BCPS AQ-ID []  , Pharm.D., BCPS []  Celedonio Miyamoto, .D., BCPS []  [place], .D., BCPS, AAHIVP []  Georgina Pillion, Pharm.D., BCPS, AAHIVP []  [address], PharmD, BCPS []  , PharmD, BCPS []  [place], PharmD, BCPS []  [place], PharmD []  , PharmD, BCPS []  Estella Husk, PharmD  Pharmacy Team []  Lysle Pearl, PharmD []  , PharmD []  Phillips Climes, PharmD []  , Rph []  Agapito Games) , PharmD []  Verlan Friends, PharmD []  , PharmD []  Mervyn Gay, PharmD []  , PharmD []  Vinnie Level, PharmD []  [facility], PharmD []  , PharmD []  Len Childs, PharmD   Positive urine culture Treated with Cephalexin, organism sensitive to the same and no further patient follow-up is required at this time.  , PA-C  Greer [place] 11/11/2020, 8:27 AM

## 2020-11-25 ENCOUNTER — Emergency Department (HOSPITAL_COMMUNITY)
Admission: EM | Admit: 2020-11-25 | Discharge: 2020-11-25 | Disposition: A | Discharge status: Home / Self Care | Payer: Managed Care, Other (non HMO) | Attending: Emergency Medicine | Admitting: Emergency Medicine

## 2020-11-25 ENCOUNTER — Other Ambulatory Visit: Payer: Self-pay

## 2020-11-25 DIAGNOSIS — Z79899 Other long term (current) drug therapy: Secondary | ICD-10-CM | POA: Insufficient documentation

## 2020-11-25 DIAGNOSIS — I10 Essential (primary) hypertension: Secondary | ICD-10-CM | POA: Insufficient documentation

## 2020-11-25 DIAGNOSIS — Y846 Urinary catheterization as the cause of abnormal reaction of the patient, or of later complication, without mention of misadventure at the time of the procedure: Secondary | ICD-10-CM | POA: Insufficient documentation

## 2020-11-25 DIAGNOSIS — T83098A Other mechanical complication of other indwelling urethral catheter, initial encounter: Secondary | ICD-10-CM | POA: Insufficient documentation

## 2020-11-25 DIAGNOSIS — T839XXA Unspecified complication of genitourinary prosthetic device, implant and graft, initial encounter: Secondary | ICD-10-CM

## 2020-11-25 NOTE — Discharge Instructions (Addendum)
Please follow-up with your urologist regarding your symptoms today.  If you develop any new or worsening symptoms please come back to the emergency department immediately.  It was a pleasure to meet you.

## 2020-11-25 NOTE — ED Provider Notes (Signed)
[facility] EMERGENCY DEPARTMENT Provider Note   CSN: [id number] Arrival date & time: 11/25/20  1443     History No chief complaint on file.   Sovereign Ramiro is a 65 y.o. male.  HPI Patient is a 65 year old male who presents to the emergency department due to leakage from his Foley catheter bag.  This was initially placed in September in the ED. he states over the past few days he noticed leaking from the catheter bag.  Denies any leakage around the urethra.  Denies any abdominal pain, fevers, nausea, vomiting.  Patient also complaining of mild edema to the left lower leg that began after his new catheter bag was placed along the left lower leg.    No past medical history on file.  Patient Active Problem List   Diagnosis Date Noted   Uncontrolled stage 2 hypertension 08/20/2016    No past surgical history on file.     No family history on file.  Social History   Tobacco Use   Smoking status: Never   Smokeless tobacco: Never  Vaping Use   Vaping Use: Never used  Substance Use Topics   Alcohol use: No   Drug use: No    Home Medications Prior to Admission medications   Medication Sig Start Date End Date Taking? Authorizing Provider  amLODipine (NORVASC) 5 MG tablet Take 1 tablet (5 mg total) by mouth daily. Come back in mid August 18 for BP recheck. 08/20/16   Ofilia Neas, PA-C  atorvastatin (LIPITOR) 20 MG tablet Take 1 tablet (20 mg total) by mouth daily. 08/23/16   Ofilia Neas, PA-C  cephALEXin (KEFLEX) 500 MG capsule Take 1 capsule (500 mg total) by mouth 3 (three) times daily. 11/08/20   Garlon Hatchet, PA-C  PARoxetine (PAXIL) 10 MG tablet Take 0.5-1.5 tablets (5-15 mg total) by mouth daily. Take daily at the same time and do not miss doses.  Ideally take 1 hour before sex. 08/20/16   Ofilia Neas, PA-C  predniSONE (DELTASONE) 50 MG tablet Take one daily for three days.  After that start taking meloxicam daily as needed for arthritis  and or back pain. 08/20/16   Ofilia Neas, PA-C    Allergies    Patient has no known allergies.  Review of Systems   Review of Systems  Constitutional:  Negative for chills and fever.  Gastrointestinal:  Negative for abdominal pain, nausea and vomiting.  Genitourinary:  Positive for difficulty urinating. Negative for dysuria, flank pain, penile discharge and penile pain.   Physical Exam Updated Vital Signs BP 129/74 (BP Location: Left Arm)   Pulse 75   Temp 98.6 F (37 C) (Oral)   Resp 18   SpO2 100%   Physical Exam Vitals and nursing note reviewed.  Constitutional:      General: He is not in acute distress.    Appearance: Normal appearance. He is not ill-appearing, toxic-appearing or diaphoretic.  HENT:     Head: Normocephalic and atraumatic.     Right Ear: External ear normal.     Left Ear: External ear normal.     Nose: Nose normal.     Mouth/Throat:     Mouth: Mucous membranes are moist.     Pharynx: Oropharynx is clear. No oropharyngeal exudate or posterior oropharyngeal erythema.  Eyes:     General: No scleral icterus.       Right eye: No discharge.        Left eye: No  discharge.     Extraocular Movements: Extraocular movements intact.     Conjunctiva/sclera: Conjunctivae normal.  Cardiovascular:     Rate and Rhythm: Normal rate and regular rhythm.     Pulses: Normal pulses.     Heart sounds: Normal heart sounds. No murmur heard.   No friction rub. No gallop.  Pulmonary:     Effort: Pulmonary effort is normal. No respiratory distress.     Breath sounds: Normal breath sounds. No stridor. No wheezing, rhonchi or rales.  Abdominal:     General: Abdomen is flat.     Palpations: Abdomen is soft.     Tenderness: There is no abdominal tenderness. There is no right CVA tenderness or left CVA tenderness.     Comments: Protuberant abdomen that is soft and nontender.  Genitourinary:    Comments: Catheter bag noted to the left lower leg.  Small amount of yellow  urine noted in the bag.  Obvious leakage noted from the inferior portion of the bag. Musculoskeletal:        General: Normal range of motion.     Cervical back: Normal range of motion and neck supple. No tenderness.     Right lower leg: No edema.     Left lower leg: Edema present.     Comments: 2 bands noted around the left lower leg to hold his catheter bag in place.  Mild edema noted just proximal to the lower band.  Skin:    General: Skin is warm and dry.  Neurological:     General: No focal deficit present.     Mental Status: He is alert and oriented to person, place, and time.  Psychiatric:        Mood and Affect: Mood normal.        Behavior: Behavior normal.   ED Results / Procedures / Treatments   Labs (all labs ordered are listed, but only abnormal results are displayed) Labs Reviewed - No data to display  EKG None  Radiology No results found.  Procedures Procedures   Medications Ordered in ED Medications - No data to display  ED Course  I have reviewed the triage vital signs and the nursing notes.  Pertinent labs & imaging results that were available during my care of the patient were reviewed by me and considered in my medical decision making (see chart for details).    MDM Rules/Calculators/A&P                          Patient is a 65 year old male who presents to the emergency department due to leakage from his Foley catheter bag.  Started over the past few days.  Denies any leakage around the urethral meatus.  No discharge, abdominal pain, fevers, chills.  Patient currently afebrile and not tachycardic.  Small amount of leakage was noted from the inferior portion of the catheter bag.  Patient also notes that since having it replaced and placed on his lower leg he has been experiencing some mild edema just proximal to the bands on his leg which were placed to support the bag.  We will have nursing staff place a new Foley catheter given the length this catheter  has been in place.  We will also have them place the new bag along his thigh.  Will give patient additional supplies.  Feel that he is stable for discharge at this time and he is agreeable.  Recommended urology follow-up.  We discussed  return precautions.  His questions were answered and he was amicable at the time of discharge.  Final Clinical Impression(s) / ED Diagnoses Final diagnoses:  Problem with Foley catheter, initial encounter [facility])   Rx / DC Orders ED Discharge Orders     None        Placido Sou, PA-C 11/25/20 1646    Eber Hong, MD 11/26/20 1501

## 2020-11-25 NOTE — ED Triage Notes (Signed)
Pt arrives reporting his leg bag is leaking urine out of the bottom. No other problems, just would like the bag changed.

## 2020-12-23 ENCOUNTER — Encounter (HOSPITAL_COMMUNITY): Payer: Self-pay | Admitting: Emergency Medicine

## 2020-12-23 ENCOUNTER — Other Ambulatory Visit: Payer: Self-pay

## 2020-12-23 ENCOUNTER — Emergency Department (HOSPITAL_COMMUNITY)
Admission: EM | Admit: 2020-12-23 | Discharge: 2020-12-23 | Disposition: A | Discharge status: Home / Self Care | Payer: Managed Care, Other (non HMO) | Attending: Emergency Medicine | Admitting: Emergency Medicine

## 2020-12-23 DIAGNOSIS — T83031A Leakage of indwelling urethral catheter, initial encounter: Secondary | ICD-10-CM | POA: Insufficient documentation

## 2020-12-23 DIAGNOSIS — I10 Essential (primary) hypertension: Secondary | ICD-10-CM | POA: Diagnosis not present

## 2020-12-23 DIAGNOSIS — Z79899 Other long term (current) drug therapy: Secondary | ICD-10-CM | POA: Diagnosis not present

## 2020-12-23 DIAGNOSIS — Y846 Urinary catheterization as the cause of abnormal reaction of the patient, or of later complication, without mention of misadventure at the time of the procedure: Secondary | ICD-10-CM | POA: Diagnosis not present

## 2020-12-23 DIAGNOSIS — T839XXA Unspecified complication of genitourinary prosthetic device, implant and graft, initial encounter: Secondary | ICD-10-CM

## 2020-12-23 NOTE — Discharge Instructions (Signed)
Please follow-up with [facility] for further management of your Foley catheter.   Get help right away if: You see blood in the catheter. Your pee is pink or red. Your bladder feels full. Your pee is not draining into the bag. Your catheter gets pulled out.

## 2020-12-23 NOTE — ED Provider Notes (Signed)
[place] EMERGENCY DEPARTMENT Provider Note   CSN: [id number] Arrival date & time: 12/23/20  1111     History Chief Complaint  Patient presents with   foley problem    Curtis Reed is a 65 y.o. male presents to the emergency department with a chief complaint of leakage to his Foley catheter bag.  Foley catheter was originally placed in September.  Patient states that bag started leaking earlier this morning.  Patient denies any leakage around urethral meatus.  Denies any abdominal pain, nausea, vomiting, dysuria, hematuria, penile discharge.  Per chart review patient was seen on 10/21 for similar complaint.  Patient Foley catheter was changed and patient was given supplies.  Patient reports that he has used all of his catheter supplies.  HPI     History reviewed. No pertinent past medical history.  Patient Active Problem List   Diagnosis Date Noted   Uncontrolled stage 2 hypertension 08/20/2016    History reviewed. No pertinent surgical history.     No family history on file.  Social History   Tobacco Use   Smoking status: Never   Smokeless tobacco: Never  Vaping Use   Vaping Use: Never used  Substance Use Topics   Alcohol use: No   Drug use: No    Home Medications Prior to Admission medications   Medication Sig Start Date End Date Taking? Authorizing Provider  amLODipine (NORVASC) 5 MG tablet Take 1 tablet (5 mg total) by mouth daily. Come back in mid August 18 for BP recheck. 08/20/16   Tereasa Coop, PA-C  atorvastatin (LIPITOR) 20 MG tablet Take 1 tablet (20 mg total) by mouth daily. 08/23/16   Tereasa Coop, PA-C  cephALEXin (KEFLEX) 500 MG capsule Take 1 capsule (500 mg total) by mouth 3 (three) times daily. 11/08/20   Larene Pickett, PA-C  PARoxetine (PAXIL) 10 MG tablet Take 0.5-1.5 tablets (5-15 mg total) by mouth daily. Take daily at the same time and do not miss doses.  Ideally take 1 hour before sex. 08/20/16   Tereasa Coop, PA-C  predniSONE (DELTASONE) 50 MG tablet Take one daily for three days.  After that start taking meloxicam daily as needed for arthritis and or back pain. 08/20/16   Tereasa Coop, PA-C    Allergies    Patient has no known allergies.  Review of Systems   Review of Systems  Constitutional:  Negative for chills and fever.  Eyes:  Negative for visual disturbance.  Respiratory:  Negative for shortness of breath.   Cardiovascular:  Negative for chest pain.  Gastrointestinal:  Negative for abdominal pain, constipation, diarrhea, nausea and vomiting.  Genitourinary:  Negative for dysuria, hematuria, penile discharge, penile pain, penile swelling, scrotal swelling, testicular pain and urgency.  Musculoskeletal:  Negative for back pain and neck pain.  Skin:  Negative for color change and rash.  Neurological:  Negative for dizziness, syncope, light-headedness and headaches.  Psychiatric/Behavioral:  Negative for confusion.    Physical Exam Updated Vital Signs BP (!) 154/81 (BP Location: Left Arm)   Pulse 77   Temp 98.1 F (36.7 C) (Oral)   Resp 14   SpO2 100%   Physical Exam Vitals and nursing note reviewed.  Constitutional:      General: He is not in acute distress.    Appearance: He is not ill-appearing, toxic-appearing or diaphoretic.  HENT:     Head: Normocephalic.  Eyes:     General: No scleral icterus.  Right eye: No discharge.        Left eye: No discharge.  Cardiovascular:     Rate and Rhythm: Normal rate.  Pulmonary:     Effort: Pulmonary effort is normal.  Abdominal:     General: Abdomen is protuberant. There is no distension. There are no signs of injury.     Palpations: Abdomen is soft. There is no mass or pulsatile mass.     Tenderness: There is no abdominal tenderness. There is no guarding or rebound.     Hernia: There is no hernia in the umbilical area or ventral area.  Genitourinary:    Penis: Circumcised. No hypospadias, erythema, tenderness,  discharge, swelling or lesions.      Testes: Normal. Cremasteric reflex is present.     Epididymis:     Right: Normal.     Left: Normal.     Tanner stage (genital): 5.     Comments: Patient has Foley catheter leg bag with moderate amount of yellow urine.  Obvious leak noted to Foley catheter bag.  Foley catheter inserted into urethra.  No signs of trauma. Skin:    General: Skin is warm and dry.  Neurological:     General: No focal deficit present.     Mental Status: He is alert.     GCS: GCS eye subscore is 4. GCS verbal subscore is 5. GCS motor subscore is 6.  Psychiatric:        Behavior: Behavior is cooperative.    ED Results / Procedures / Treatments   Labs (all labs ordered are listed, but only abnormal results are displayed) Labs Reviewed - No data to display  EKG None  Radiology No results found.  Procedures Procedures   Medications Ordered in ED Medications - No data to display  ED Course  I have reviewed the triage vital signs and the nursing notes.  Pertinent labs & imaging results that were available during my care of the patient were reviewed by me and considered in my medical decision making (see chart for details).    MDM Rules/Calculators/A&P                           Alert 65 year old male no acute distress, nontoxic-appearing.  Presents to ED with chief complaint of leaking Foley catheter bag.  Patient denies any leakage around the urethral meatus.  On exam patient has no trauma to urethral meatus.  Clear yellow urine noted to Foley catheter bag.  Foley catheter bag has obvious leak.  Bag was changed in ED.  Patient given additional supplies.  Patient given information to follow-up with urology in outpatient setting.  Discussed results, findings, treatment and follow up. Patient advised of return precautions. Patient verbalized understanding and agreed with plan.   Final Clinical Impression(s) / ED Diagnoses Final diagnoses:  None    Rx / DC  Orders ED Discharge Orders     None        Berneice Heinrich 12/23/20 1340    Terrilee Files, MD 12/23/20 [phone]

## 2020-12-23 NOTE — ED Triage Notes (Signed)
Patient coming from home, states that his leg bag is leaking.

## 2021-01-03 ENCOUNTER — Other Ambulatory Visit: Payer: Self-pay | Admitting: Urology

## 2021-01-06 NOTE — Progress Notes (Signed)
Sent message, via epic in basket, requesting orders in epic from surgeon.  

## 2021-01-11 NOTE — Patient Instructions (Signed)
DUE TO COVID-19 ONLY ONE VISITOR IS ALLOWED TO COME WITH YOU AND STAY IN THE WAITING ROOM ONLY DURING PRE OP AND PROCEDURE.   **NO VISITORS ARE ALLOWED IN THE SHORT STAY AREA OR RECOVERY ROOM!!**  IF YOU WILL BE ADMITTED INTO THE HOSPITAL YOU ARE ALLOWED ONLY TWO SUPPORT PEOPLE DURING VISITATION HOURS ONLY (7 AM -8PM)   The support person(s) must pass our screening, gel in and out, and wear a mask at all times, including in the patient's room. Patients must also wear a mask when staff or their support person are in the room. Visitors GUEST BADGE MUST BE WORN VISIBLY  One adult visitor may remain with you overnight and MUST be in the room by 8 P.M.  No visitors under the age of 44. Any visitor under the age of 23 must be accompanied by an adult.    COVID SWAB TESTING MUST BE COMPLETED ON:  01/23/21 **MUST PRESENT COMPLETED FORM AT TESTING SITE**    [address] [place] (backside of the building) You are not required to quarantine, however you are required to wear a well-fitted mask when you are out and around people not in your household.  Hand Hygiene often Do NOT share personal items Notify your provider if you are in close contact with someone who has COVID or you develop fever 100.4 or greater, new onset of sneezing, cough, sore throat, shortness of breath or body aches.  [facility] Entr[facility] [address], [address], must go inside of the hospital, NOT A DRIVE THRU!  (Must self quarantine after testing. Follow instructions on handout.)       Your procedure is scheduled on:01/25/21    Report to [facility] Main Entrance    Report to admitting at : 9:45 AM   Call this number if you have problems the morning of surgery [phone]   Do not eat food :After Midnight.   May have liquids until : 9:00 AM   day of surgery  CLEAR LIQUID DIET  Foods Allowed                                                                      Foods Excluded  Water, Black Coffee and tea, regular and decaf                             liquids that you cannot  Plain Jell-O in any flavor  (No red)                                           see through such as: Fruit ices (not with fruit pulp)                                     milk, soups, orange juice              Iced Popsicles (No red)  All solid food                                   Apple juices Sports drinks like Gatorade (No red) Lightly seasoned clear broth or consume(fat free) Sugar  Sample Menu Breakfast                                Lunch                                     Supper Cranberry juice                    Beef broth                            Chicken broth Jell-O                                     Grape juice                           Apple juice Coffee or tea                        Jell-O                                      Popsicle                                                Coffee or tea                        Coffee or tea       Oral Hygiene is also important to reduce your risk of infection.                                    Remember - BRUSH YOUR TEETH THE MORNING OF SURGERY WITH YOUR REGULAR TOOTHPASTE   Do NOT smoke after Midnight   Take these medicines the morning of surgery with A SIP OF WATER: N/A  DO NOT TAKE ANY ORAL DIABETIC MEDICATIONS DAY OF YOUR SURGERY                              You may not have any metal on your body including hair pins, jewelry, and body piercing             Do not wear  lotions, powders, perfumes/cologne, or deodorant               Men may shave face and neck.   Do not bring valuables to the hospital. [facility] IS NOT             RESPONSIBLE   FOR  VALUABLES.   Contacts, dentures or bridgework may not be worn into surgery.   Bring small overnight bag day of surgery.    Patients discharged on the day of surgery will not be allowed to drive home.   Special  Instructions: Bring a copy of your healthcare power of attorney and living will documents         the day of surgery if you haven't scanned them before.              Please read over the following fact sheets you were given: IF YOU HAVE QUESTIONS ABOUT YOUR PRE-OP INSTRUCTIONS PLEASE CALL [phone]     [facility] - Preparing for Surgery Before surgery, you can play an important role.  Because skin is not sterile, your skin needs to be as free of germs as possible.  You can reduce the number of germs on your skin by washing with CHG (chlorahexidine gluconate) soap before surgery.  CHG is an antiseptic cleaner which kills germs and bonds with the skin to continue killing germs even after washing. Please DO NOT use if you have an allergy to CHG or antibacterial soaps.  If your skin becomes reddened/irritated stop using the CHG and inform your nurse when you arrive at Short Stay. Do not shave (including legs and underarms) for at least 48 hours prior to the first CHG shower.  You may shave your face/neck. Please follow these instructions carefully:  1.  Shower with CHG Soap the night before surgery and the  morning of Surgery.  2.  If you choose to wash your hair, wash your hair first as usual with your  normal  shampoo.  3.  After you shampoo, rinse your hair and body thoroughly to remove the  shampoo.                           4.  Use CHG as you would any other liquid soap.  You can apply chg directly  to the skin and wash                       Gently with a scrungie or clean washcloth.  5.  Apply the CHG Soap to your body ONLY FROM THE NECK DOWN.   Do not use on face/ open                           Wound or open sores. Avoid contact with eyes, ears mouth and genitals (private parts).                       Wash face,  Genitals (private parts) with your normal soap.             6.  Wash thoroughly, paying special attention to the area where your surgery  will be performed.  7.  Thoroughly rinse  your body with warm water from the neck down.  8.  DO NOT shower/wash with your normal soap after using and rinsing off  the CHG Soap.                9.  Pat yourself dry with a clean towel.            10.  Wear clean pajamas.            11.  Place clean sheets on your bed the  night of your first shower and do not  sleep with pets. Day of Surgery : Do not apply any lotions/deodorants the morning of surgery.  Please wear clean clothes to the hospital/surgery center.  FAILURE TO FOLLOW THESE INSTRUCTIONS MAY RESULT IN THE CANCELLATION OF YOUR SURGERY PATIENT SIGNATURE_________________________________  NURSE SIGNATURE__________________________________  ________________________________________________________________________

## 2021-01-12 ENCOUNTER — Other Ambulatory Visit: Payer: Self-pay

## 2021-01-12 ENCOUNTER — Encounter (HOSPITAL_COMMUNITY): Payer: Self-pay

## 2021-01-12 ENCOUNTER — Encounter (HOSPITAL_COMMUNITY)
Admission: RE | Admit: 2021-01-12 | Discharge: 2021-01-12 | Disposition: A | Discharge status: Home / Self Care | Payer: Managed Care, Other (non HMO) | Source: Ambulatory Visit | Attending: Urology | Admitting: Urology

## 2021-01-12 VITALS — BP 142/84 | HR 90 | Temp 98.6°F | Ht 65.0 in | Wt 196.0 lb

## 2021-01-12 DIAGNOSIS — I10 Essential (primary) hypertension: Secondary | ICD-10-CM | POA: Diagnosis not present

## 2021-01-12 DIAGNOSIS — Z01818 Encounter for other preprocedural examination: Secondary | ICD-10-CM | POA: Diagnosis present

## 2021-01-12 HISTORY — DX: Essential (primary) hypertension: I10

## 2021-01-12 LAB — TYPE AND SCREEN
ABO/RH(D): O POS
Antibody Screen: NEGATIVE

## 2021-01-12 LAB — CBC
HCT: 44 % (ref 39.0–52.0)
Hemoglobin: 14.4 g/dL (ref 13.0–17.0)
MCH: 30 pg (ref 26.0–34.0)
MCHC: 32.7 g/dL (ref 30.0–36.0)
MCV: 91.7 fL (ref 80.0–100.0)
Platelets: 234 10*3/uL (ref 150–400)
RBC: 4.8 MIL/uL (ref 4.22–5.81)
RDW: 13.9 % (ref 11.5–15.5)
WBC: 5.9 10*3/uL (ref 4.0–10.5)
nRBC: 0 % (ref 0.0–0.2)

## 2021-01-12 LAB — BASIC METABOLIC PANEL
Anion gap: 5 (ref 5–15)
BUN: 14 mg/dL (ref 8–23)
CO2: 28 mmol/L (ref 22–32)
Calcium: 9.2 mg/dL (ref 8.9–10.3)
Chloride: 103 mmol/L (ref 98–111)
Creatinine, Ser: 1.04 mg/dL (ref 0.61–1.24)
GFR, Estimated: >60 mL/min (ref >60)
Glucose, Bld: 81 mg/dL (ref 70–99)
Potassium: 4.1 mmol/L (ref 3.5–5.1)
Sodium: 136 mmol/L (ref 135–145)

## 2021-01-12 NOTE — Progress Notes (Signed)
COVID Vaccine Completed: Yes Date COVID Vaccine completed: 2021 x 2 COVID vaccine manufacturer: Pfizer    COVID Test: 01/23/21 PCP - NO PCP Cardiologist -   Chest x-ray -  EKG -  Stress Test -  ECHO -  Cardiac Cath -  Pacemaker/ICD device last checked:  Sleep Study -  CPAP -   Fasting Blood Sugar -  Checks Blood Sugar _____ times a day  Blood Thinner Instructions: Aspirin Instructions: Last Dose:  Anesthesia review: Hx: HTN  Patient denies shortness of breath, fever, cough and chest pain at PAT appointment   Patient verbalized understanding of instructions that were given to them at the PAT appointment. Patient was also instructed that they will need to review over the PAT instructions again at home before surgery.

## 2021-01-23 ENCOUNTER — Other Ambulatory Visit: Payer: Self-pay | Admitting: Urology

## 2021-01-24 ENCOUNTER — Encounter (HOSPITAL_COMMUNITY): Payer: Self-pay | Admitting: Urology

## 2021-01-24 LAB — SARS CORONAVIRUS 2 (TAT 6-24 HRS): SARS Coronavirus 2: NEGATIVE

## 2021-01-25 ENCOUNTER — Encounter (HOSPITAL_COMMUNITY): Payer: Self-pay | Admitting: Urology

## 2021-01-25 ENCOUNTER — Inpatient Hospital Stay (HOSPITAL_COMMUNITY): Payer: Managed Care, Other (non HMO) | Admitting: Anesthesiology

## 2021-01-25 ENCOUNTER — Inpatient Hospital Stay (HOSPITAL_COMMUNITY)
Admission: RE | Admit: 2021-01-25 | Discharge: 2021-01-27 | DRG: 708 | Disposition: A | Discharge status: Home / Self Care | Payer: Managed Care, Other (non HMO) | Source: Ambulatory Visit | Attending: Urology | Admitting: Urology

## 2021-01-25 ENCOUNTER — Other Ambulatory Visit: Payer: Self-pay

## 2021-01-25 ENCOUNTER — Encounter (HOSPITAL_COMMUNITY)
Admission: RE | Disposition: A | Discharge status: Home / Self Care | Payer: Self-pay | Source: Ambulatory Visit | Attending: Urology

## 2021-01-25 DIAGNOSIS — Z79899 Other long term (current) drug therapy: Secondary | ICD-10-CM | POA: Diagnosis not present

## 2021-01-25 DIAGNOSIS — N401 Enlarged prostate with lower urinary tract symptoms: Secondary | ICD-10-CM | POA: Diagnosis present

## 2021-01-25 DIAGNOSIS — R338 Other retention of urine: Secondary | ICD-10-CM | POA: Diagnosis present

## 2021-01-25 DIAGNOSIS — N138 Other obstructive and reflux uropathy: Secondary | ICD-10-CM | POA: Diagnosis present

## 2021-01-25 DIAGNOSIS — I10 Essential (primary) hypertension: Secondary | ICD-10-CM | POA: Diagnosis present

## 2021-01-25 DIAGNOSIS — Z20822 Contact with and (suspected) exposure to covid-19: Secondary | ICD-10-CM | POA: Diagnosis present

## 2021-01-25 HISTORY — PX: XI ROBOTIC ASSISTED SIMPLE PROSTATECTOMY: SHX6713

## 2021-01-25 LAB — HEMOGLOBIN AND HEMATOCRIT, BLOOD
HCT: 42.2 % (ref 39.0–52.0)
Hemoglobin: 13.9 g/dL (ref 13.0–17.0)

## 2021-01-25 LAB — ABO/RH: ABO/RH(D): O POS

## 2021-01-25 SURGERY — PROSTATECTOMY, SIMPLE, ROBOT-ASSISTED
Anesthesia: General

## 2021-01-25 MED ORDER — PROPOFOL 10 MG/ML IV BOLUS
INTRAVENOUS | Status: AC
  Filled 2021-01-25: qty 20

## 2021-01-25 MED ORDER — CEPHALEXIN 500 MG PO CAPS: 500.0000 mg | ORAL_CAPSULE | Freq: Three times a day (TID) | ORAL | 0 refills | Status: AC

## 2021-01-25 MED ORDER — BUPIVACAINE LIPOSOME 1.3 % IJ SUSP
INTRAMUSCULAR | Status: DC | PRN
  Administered 2021-01-25: 20 mL

## 2021-01-25 MED ORDER — LIDOCAINE 2% (20 MG/ML) 5 ML SYRINGE
INTRAMUSCULAR | Status: DC | PRN
  Administered 2021-01-25: 80 mg via INTRAVENOUS

## 2021-01-25 MED ORDER — CHLORHEXIDINE GLUCONATE 0.12 % MT SOLN
15.0000 mL | Freq: Once | OROMUCOSAL | Status: AC
  Administered 2021-01-25: 11:00:00 15 mL via OROMUCOSAL

## 2021-01-25 MED ORDER — FENTANYL CITRATE PF 50 MCG/ML IJ SOSY
25.0000 ug | PREFILLED_SYRINGE | INTRAMUSCULAR | Status: DC | PRN
  Administered 2021-01-25: 15:00:00 50 ug via INTRAVENOUS

## 2021-01-25 MED ORDER — ONDANSETRON HCL 4 MG/2ML IJ SOLN: 4.0000 mg | INTRAMUSCULAR | Status: DC | PRN

## 2021-01-25 MED ORDER — DEXMEDETOMIDINE (PRECEDEX) IN NS 20 MCG/5ML (4 MCG/ML) IV SYRINGE
PREFILLED_SYRINGE | INTRAVENOUS | Status: AC
  Filled 2021-01-25: qty 5

## 2021-01-25 MED ORDER — BELLADONNA ALKALOIDS-OPIUM 16.2-60 MG RE SUPP: 1.0000 | Freq: Four times a day (QID) | RECTAL | Status: DC | PRN

## 2021-01-25 MED ORDER — FENTANYL CITRATE (PF) 100 MCG/2ML IJ SOLN
INTRAMUSCULAR | Status: DC | PRN
  Administered 2021-01-25 (×2): 50 ug via INTRAVENOUS
  Administered 2021-01-25: 100 ug via INTRAVENOUS
  Administered 2021-01-25: 50 ug via INTRAVENOUS

## 2021-01-25 MED ORDER — HYDROCODONE-ACETAMINOPHEN 5-325 MG PO TABS: 1.0000 | ORAL_TABLET | Freq: Four times a day (QID) | ORAL | 0 refills | Status: AC | PRN

## 2021-01-25 MED ORDER — SUGAMMADEX SODIUM 200 MG/2ML IV SOLN
INTRAVENOUS | Status: DC | PRN
  Administered 2021-01-25: 200 mg via INTRAVENOUS

## 2021-01-25 MED ORDER — MEPERIDINE HCL 50 MG/ML IJ SOLN: 6.2500 mg | INTRAMUSCULAR | Status: DC | PRN

## 2021-01-25 MED ORDER — BUPIVACAINE LIPOSOME 1.3 % IJ SUSP
INTRAMUSCULAR | Status: AC
  Filled 2021-01-25: qty 20

## 2021-01-25 MED ORDER — ORAL CARE MOUTH RINSE: 15.0000 mL | Freq: Once | OROMUCOSAL | Status: AC

## 2021-01-25 MED ORDER — ACETAMINOPHEN 325 MG PO TABS: 325.0000 mg | ORAL_TABLET | ORAL | Status: DC | PRN

## 2021-01-25 MED ORDER — DEXMEDETOMIDINE (PRECEDEX) IN NS 20 MCG/5ML (4 MCG/ML) IV SYRINGE
PREFILLED_SYRINGE | INTRAVENOUS | Status: DC | PRN
  Administered 2021-01-25 (×5): 4 ug via INTRAVENOUS

## 2021-01-25 MED ORDER — ONDANSETRON HCL 4 MG/2ML IJ SOLN
INTRAMUSCULAR | Status: DC | PRN
  Administered 2021-01-25: 4 mg via INTRAVENOUS

## 2021-01-25 MED ORDER — ONDANSETRON HCL 4 MG/2ML IJ SOLN: 4.0000 mg | Freq: Once | INTRAMUSCULAR | Status: DC | PRN

## 2021-01-25 MED ORDER — PROPOFOL 10 MG/ML IV BOLUS
INTRAVENOUS | Status: DC | PRN
  Administered 2021-01-25: 30 mg via INTRAVENOUS
  Administered 2021-01-25: 150 mg via INTRAVENOUS

## 2021-01-25 MED ORDER — CEFAZOLIN SODIUM-DEXTROSE 2-4 GM/100ML-% IV SOLN
2.0000 g | INTRAVENOUS | Status: AC
  Administered 2021-01-25: 12:00:00 2 g via INTRAVENOUS
  Filled 2021-01-25: qty 100

## 2021-01-25 MED ORDER — SODIUM CHLORIDE (PF) 0.9 % IJ SOLN
INTRAMUSCULAR | Status: DC | PRN
  Administered 2021-01-25: 20 mL

## 2021-01-25 MED ORDER — OXYCODONE HCL 5 MG PO TABS
5.0000 mg | ORAL_TABLET | ORAL | Status: DC | PRN
  Administered 2021-01-25 – 2021-01-27 (×5): 5 mg via ORAL
  Filled 2021-01-25 (×5): qty 1

## 2021-01-25 MED ORDER — DOCUSATE SODIUM 100 MG PO CAPS
100.0000 mg | ORAL_CAPSULE | Freq: Two times a day (BID) | ORAL | Status: DC
  Administered 2021-01-25 – 2021-01-26 (×3): 100 mg via ORAL
  Filled 2021-01-25 (×4): qty 1

## 2021-01-25 MED ORDER — ACETAMINOPHEN 500 MG PO TABS
1000.0000 mg | ORAL_TABLET | Freq: Four times a day (QID) | ORAL | Status: AC
  Administered 2021-01-25 – 2021-01-26 (×4): 1000 mg via ORAL
  Filled 2021-01-25 (×4): qty 2

## 2021-01-25 MED ORDER — FINASTERIDE 5 MG PO TABS
5.0000 mg | ORAL_TABLET | Freq: Every day | ORAL | Status: DC
  Administered 2021-01-25 – 2021-01-27 (×3): 5 mg via ORAL
  Filled 2021-01-25 (×3): qty 1

## 2021-01-25 MED ORDER — SODIUM CHLORIDE 0.9 % IV BOLUS
1000.0000 mL | Freq: Once | INTRAVENOUS | Status: AC
  Administered 2021-01-25: 15:00:00 1000 mL via INTRAVENOUS

## 2021-01-25 MED ORDER — SODIUM CHLORIDE (PF) 0.9 % IJ SOLN
INTRAMUSCULAR | Status: AC
  Filled 2021-01-25: qty 20

## 2021-01-25 MED ORDER — FENTANYL CITRATE PF 50 MCG/ML IJ SOSY
PREFILLED_SYRINGE | INTRAMUSCULAR | Status: AC
  Filled 2021-01-25: qty 3

## 2021-01-25 MED ORDER — DIPHENHYDRAMINE HCL 12.5 MG/5ML PO ELIX
12.5000 mg | ORAL_SOLUTION | Freq: Four times a day (QID) | ORAL | Status: DC | PRN
Start: 2021-01-25 — End: 2021-01-27

## 2021-01-25 MED ORDER — DIPHENHYDRAMINE HCL 50 MG/ML IJ SOLN
12.5000 mg | Freq: Four times a day (QID) | INTRAMUSCULAR | Status: DC | PRN
Start: 2021-01-25 — End: 2021-01-27

## 2021-01-25 MED ORDER — ACETAMINOPHEN 160 MG/5ML PO SOLN: 325.0000 mg | ORAL | Status: DC | PRN

## 2021-01-25 MED ORDER — MIDAZOLAM HCL 2 MG/2ML IJ SOLN
INTRAMUSCULAR | Status: AC
  Filled 2021-01-25: qty 2

## 2021-01-25 MED ORDER — DEXAMETHASONE SODIUM PHOSPHATE 10 MG/ML IJ SOLN
INTRAMUSCULAR | Status: DC | PRN
  Administered 2021-01-25: 10 mg via INTRAVENOUS

## 2021-01-25 MED ORDER — HYDROMORPHONE HCL 1 MG/ML IJ SOLN: 0.5000 mg | INTRAMUSCULAR | Status: DC | PRN

## 2021-01-25 MED ORDER — DOCUSATE SODIUM 100 MG PO CAPS: 100.0000 mg | ORAL_CAPSULE | Freq: Two times a day (BID) | ORAL | Status: AC

## 2021-01-25 MED ORDER — OXYCODONE HCL 5 MG/5ML PO SOLN: 5.0000 mg | Freq: Once | ORAL | Status: DC | PRN

## 2021-01-25 MED ORDER — OXYCODONE HCL 5 MG PO TABS: 5.0000 mg | ORAL_TABLET | Freq: Once | ORAL | Status: DC | PRN

## 2021-01-25 MED ORDER — STERILE WATER FOR IRRIGATION IR SOLN
Status: DC | PRN
  Administered 2021-01-25: 1000 mL

## 2021-01-25 MED ORDER — FENTANYL CITRATE (PF) 250 MCG/5ML IJ SOLN
INTRAMUSCULAR | Status: AC
  Filled 2021-01-25: qty 5

## 2021-01-25 MED ORDER — LACTATED RINGERS IR SOLN
Status: DC | PRN
  Administered 2021-01-25: 1000 mL

## 2021-01-25 MED ORDER — ROCURONIUM BROMIDE 10 MG/ML (PF) SYRINGE
PREFILLED_SYRINGE | INTRAVENOUS | Status: DC | PRN
  Administered 2021-01-25 (×2): 10 mg via INTRAVENOUS
  Administered 2021-01-25: 80 mg via INTRAVENOUS

## 2021-01-25 MED ORDER — BACITRACIN-NEOMYCIN-POLYMYXIN 400-5-5000 EX OINT: 1.0000 "application " | TOPICAL_OINTMENT | Freq: Three times a day (TID) | CUTANEOUS | Status: DC | PRN

## 2021-01-25 MED ORDER — MIDAZOLAM HCL 5 MG/5ML IJ SOLN
INTRAMUSCULAR | Status: DC | PRN
  Administered 2021-01-25: 2 mg via INTRAVENOUS

## 2021-01-25 MED ORDER — LACTATED RINGERS IV SOLN: INTRAVENOUS | Status: DC

## 2021-01-25 MED ORDER — DEXTROSE-NACL 5-0.45 % IV SOLN: INTRAVENOUS | Status: DC

## 2021-01-25 MED ORDER — CHLORHEXIDINE GLUCONATE CLOTH 2 % EX PADS
6.0000 | MEDICATED_PAD | Freq: Every day | CUTANEOUS | Status: DC
  Administered 2021-01-26 – 2021-01-27 (×2): 6 via TOPICAL

## 2021-01-25 MED ORDER — POLYETHYLENE GLYCOL 3350 17 GM/SCOOP PO POWD: 1.0000 | Freq: Once | ORAL | Status: DC

## 2021-01-25 SURGICAL SUPPLY — 68 items
APPLICATOR COTTON TIP 6 STRL (MISCELLANEOUS) ×1 IMPLANT
APPLICATOR COTTON TIP 6IN STRL (MISCELLANEOUS) ×3
BAG COUNTER SPONGE SURGICOUNT (BAG) IMPLANT
BAG SURGICOUNT SPONGE COUNTING (BAG)
CATH FOLEY 2WAY SLVR 18FR 30CC (CATHETERS) ×3 IMPLANT
CATH FOLEY 3WAY 30CC 24FR (CATHETERS) ×3
CATH TIEMANN FOLEY 18FR 5CC (CATHETERS) IMPLANT
CATH URTH STD 24FR FL 3W 2 (CATHETERS) ×1 IMPLANT
CHLORAPREP W/TINT 26 (MISCELLANEOUS) ×3 IMPLANT
CLIP LIGATING HEM O LOK PURPLE (MISCELLANEOUS) IMPLANT
CLOTH BEACON ORANGE TIMEOUT ST (SAFETY) ×3 IMPLANT
COVER SURGICAL LIGHT HANDLE (MISCELLANEOUS) ×3 IMPLANT
COVER TIP SHEARS 8 DVNC (MISCELLANEOUS) ×1 IMPLANT
COVER TIP SHEARS 8MM DA VINCI (MISCELLANEOUS) ×2
CUTTER ECHEON FLEX ENDO 45 340 (ENDOMECHANICALS) ×2 IMPLANT
DECANTER SPIKE VIAL GLASS SM (MISCELLANEOUS) ×3 IMPLANT
DERMABOND ADVANCED (GAUZE/BANDAGES/DRESSINGS) ×2
DERMABOND ADVANCED .7 DNX12 (GAUZE/BANDAGES/DRESSINGS) ×1 IMPLANT
DRAIN CHANNEL RND F F (WOUND CARE) IMPLANT
DRAPE ARM DVNC X/XI (DISPOSABLE) ×4 IMPLANT
DRAPE COLUMN DVNC XI (DISPOSABLE) ×1 IMPLANT
DRAPE DA VINCI XI ARM (DISPOSABLE) ×8
DRAPE DA VINCI XI COLUMN (DISPOSABLE) ×2
DRAPE SURG IRRIG POUCH 19X23 (DRAPES) ×3 IMPLANT
DRSG TEGADERM 4X4.75 (GAUZE/BANDAGES/DRESSINGS) ×6 IMPLANT
ELECT PENCIL ROCKER SW 15FT (MISCELLANEOUS) ×3 IMPLANT
ELECT REM PT RETURN 15FT ADLT (MISCELLANEOUS) ×3 IMPLANT
GLOVE SURG ENC MOIS LTX SZ6.5 (GLOVE) ×3 IMPLANT
GLOVE SURG ENC TEXT LTX SZ7.5 (GLOVE) ×6 IMPLANT
GLOVE SURG UNDER POLY LF SZ7.5 (GLOVE) ×3 IMPLANT
GOWN STRL REUS W/TWL LRG LVL3 (GOWN DISPOSABLE) ×9 IMPLANT
HOLDER FOLEY CATH W/STRAP (MISCELLANEOUS) ×3 IMPLANT
IRRIG SUCT STRYKERFLOW 2 WTIP (MISCELLANEOUS) ×3
IRRIGATION SUCT STRKRFLW 2 WTP (MISCELLANEOUS) ×1 IMPLANT
IV LACTATED RINGERS 1000ML (IV SOLUTION) ×3 IMPLANT
KIT TURNOVER KIT A (KITS) IMPLANT
NDL INSUFFLATION 14GA 120MM (NEEDLE) ×1 IMPLANT
NEEDLE INSUFFLATION 14GA 120MM (NEEDLE) ×3 IMPLANT
PACK ROBOT UROLOGY CUSTOM (CUSTOM PROCEDURE TRAY) ×3 IMPLANT
PAD POSITIONING PINK XL (MISCELLANEOUS) ×3 IMPLANT
PENCIL SMOKE EVACUATOR (MISCELLANEOUS) IMPLANT
PORT ACCESS TROCAR AIRSEAL 12 (TROCAR) ×1 IMPLANT
PORT ACCESS TROCAR AIRSEAL 5M (TROCAR) ×2
RELOAD STAPLE 45 4.1 GRN THCK (STAPLE) IMPLANT
SCISSORS LAP 5X45 EPIX DISP (ENDOMECHANICALS) ×2 IMPLANT
SEAL CANN UNIV 5-8 DVNC XI (MISCELLANEOUS) ×4 IMPLANT
SEAL XI 5MM-8MM UNIVERSAL (MISCELLANEOUS) ×8
SET TRI-LUMEN FLTR TB AIRSEAL (TUBING) ×3 IMPLANT
SOLUTION ELECTROLUBE (MISCELLANEOUS) ×3 IMPLANT
SPONGE T-LAP 4X18 ~~LOC~~+RFID (SPONGE) ×3 IMPLANT
STAPLE RELOAD 45 GRN (STAPLE) ×1 IMPLANT
STAPLE RELOAD 45MM GREEN (STAPLE) ×3
SUT ETHILON 3 0 PS 1 (SUTURE) ×3 IMPLANT
SUT MNCRL AB 4-0 PS2 18 (SUTURE) ×6 IMPLANT
SUT PDS AB 1 CT1 27 (SUTURE) ×6 IMPLANT
SUT V-LOC BARB 180 2/0GR6 GS22 (SUTURE) ×6
SUT VIC AB 0 CT1 27 (SUTURE) ×9
SUT VIC AB 0 CT1 27XBRD ANTBC (SUTURE) ×3 IMPLANT
SUT VIC AB 2-0 SH 27 (SUTURE) ×3
SUT VIC AB 2-0 SH 27X BRD (SUTURE) ×1 IMPLANT
SUT VICRYL 0 UR6 27IN ABS (SUTURE) ×3 IMPLANT
SUT VLOC BARB 180 ABS3/0GR12 (SUTURE) ×12
SUTURE V-LC BRB 180 2/0GR6GS22 (SUTURE) ×2 IMPLANT
SUTURE VLOC BRB 180 ABS3/0GR12 (SUTURE) ×2 IMPLANT
TOWEL OR NON WOVEN STRL DISP B (DISPOSABLE) ×3 IMPLANT
TROCAR BLADELESS OPT 5 100 (ENDOMECHANICALS) ×2 IMPLANT
TROCAR XCEL NON-BLD 5MMX100MML (ENDOMECHANICALS) IMPLANT
WATER STERILE IRR 1000ML POUR (IV SOLUTION) ×3 IMPLANT

## 2021-01-25 NOTE — Anesthesia Procedure Notes (Signed)
Procedure Name: Intubation Date/Time: 01/25/2021 11:57 AM Performed by: Lavina Hamman, CRNA Pre-anesthesia Checklist: Patient identified, Emergency Drugs available, Suction available and Patient being monitored Patient Re-evaluated:Patient Re-evaluated prior to induction Oxygen Delivery Method: Circle System Utilized Preoxygenation: Pre-oxygenation with 100% oxygen Induction Type: IV induction Ventilation: Mask ventilation without difficulty and Oral airway inserted - appropriate to patient size Laryngoscope Size: Mac and 4 Grade View: Grade I Tube type: Oral Tube size: 7.0 mm Number of attempts: 1 Airway Equipment and Method: Stylet and Oral airway Placement Confirmation: ETT inserted through vocal cords under direct vision, positive ETCO2 and breath sounds checked- equal and bilateral Tube secured with: Tape Dental Injury: Teeth and Oropharynx as per pre-operative assessment

## 2021-01-25 NOTE — Discharge Instructions (Signed)

## 2021-01-25 NOTE — Anesthesia Preprocedure Evaluation (Addendum)
Anesthesia Evaluation  Patient identified by MRN, date of birth, ID band Patient awake    Reviewed: Allergy & Precautions, H&P , NPO status , Patient's Chart, lab work & pertinent test results, reviewed documented beta blocker date and time   Airway Mallampati: I  TM Distance: >3 FB Neck ROM: full    Dental no notable dental hx. (+) Teeth Intact, Dental Advisory Given, Missing,    Pulmonary neg pulmonary ROS,    Pulmonary exam normal breath sounds clear to auscultation       Cardiovascular Exercise Tolerance: Good hypertension, Pt. on medications negative cardio ROS   Rhythm:regular Rate:Normal     Neuro/Psych negative neurological ROS  negative psych ROS   GI/Hepatic negative GI ROS, Neg liver ROS,   Endo/Other  negative endocrine ROS  Renal/GU negative Renal ROS  negative genitourinary   Musculoskeletal   Abdominal   Peds  Hematology negative hematology ROS (+)   Anesthesia Other Findings   Reproductive/Obstetrics negative OB ROS                            Anesthesia Physical Anesthesia Plan  ASA: 2  Anesthesia Plan: General   Post-op Pain Management: Toradol IV (intra-op) and Ketamine IV   Induction: Intravenous  PONV Risk Score and Plan: 2 and Ondansetron, Dexamethasone and Treatment may vary due to age or medical condition  Airway Management Planned: Oral ETT  Additional Equipment: None  Intra-op Plan:   Post-operative Plan: Extubation in OR  Informed Consent: I have reviewed the patients History and Physical, chart, labs and discussed the procedure including the risks, benefits and alternatives for the proposed anesthesia with the patient or authorized representative who has indicated his/her understanding and acceptance.     Dental Advisory Given  Plan Discussed with: CRNA and Anesthesiologist  Anesthesia Plan Comments: (  )        Anesthesia Quick  Evaluation

## 2021-01-25 NOTE — Plan of Care (Signed)

## 2021-01-25 NOTE — H&P (Signed)
Curtis Reed is an 65 y.o. male.    Chief Complaint: Pre-Op Simple Prostatectomy  HPI:   1 - Massive Prostate With Retention - 280gm prostate with refractory retention.   Today Curtis Reed (pronounced Buy-Yay) is seen to proceed with simple prostatecotmy . No interval fevers.   Past Medical History:  Diagnosis Date   Hypertension     Past Surgical History:  Procedure Laterality Date   NO PAST SURGERIES      History reviewed. No pertinent family history. Social History:  reports that he has never smoked. He has never used smokeless tobacco. He reports that he does not drink alcohol and does not use drugs.  Allergies: No Known Allergies  No medications prior to admission.    No results found for this or any previous visit (from the past 48 hour(s)). No results found.  Review of Systems  Constitutional:  Negative for chills and fever.  All other systems reviewed and are negative.  There were no vitals taken for this visit. Physical Exam Vitals reviewed.  HENT:     Head: Normocephalic.  Eyes:     Pupils: Pupils are equal, round, and reactive to light.  Cardiovascular:     Rate and Rhythm: Normal rate.  Pulmonary:     Effort: Pulmonary effort is normal.  Abdominal:     General: Abdomen is flat.  Genitourinary:    Comments: Foley in place with non-foul urine. No CVAT Musculoskeletal:        General: Normal range of motion.     Cervical back: Normal range of motion.  Skin:    General: Skin is warm.  Neurological:     Mental Status: He is alert.  Psychiatric:        Mood and Affect: Mood normal.     Assessment/Plan  Proceed as planned with robotic simple prostatectomy. Risks,  benefits, alternatives, expected peri-op course discussed previously and reiterated today.   Sebastian Ache, MD 01/25/2021, 8:24 AM

## 2021-01-25 NOTE — Brief Op Note (Signed)
01/25/2021  2:45 PM  PATIENT:  Curtis Reed  65 y.o. male  PRE-OPERATIVE DIAGNOSIS:  MASSIVE PROSTATE, URINARY RETENTION  POST-OPERATIVE DIAGNOSIS:  MASSIVE PROSTATE, URINARY RETENTION  PROCEDURE:  Procedure(s) with comments: XI ROBOTIC ASSISTED SIMPLE PROSTATECTOMY (N/A) - 3 HRS  SURGEON:  Surgeon(s) and Role:    Sebastian Ache, MD - Primary  PHYSICIAN ASSISTANT:   ASSISTANTS: Harrie Foreman PA   ANESTHESIA:   general  EBL:  500 mL   BLOOD ADMINISTERED:none  DRAINS:  1 - JP to bulb; 2 - foley to gravity    LOCAL MEDICATIONS USED:  MARCAINE     SPECIMEN:  Source of Specimen:  prostate adenoma  DISPOSITION OF SPECIMEN:  PATHOLOGY  COUNTS:  YES  TOURNIQUET:  * No tourniquets in log *  DICTATION: .Other Dictation: Dictation Number [id number]  PLAN OF CARE: Admit for overnight observation  PATIENT DISPOSITION:  PACU - hemodynamically stable.   Delay start of Pharmacological VTE agent (>24hrs) due to surgical blood loss or risk of bleeding: yes

## 2021-01-25 NOTE — OR Nursing (Signed)
Patient came to OR with a foley catheter that was placed in Dr. Emmaline Life office.  Removed in the OR at 1200 prior to surgery.

## 2021-01-25 NOTE — Anesthesia Postprocedure Evaluation (Signed)
Anesthesia Post Note  Patient: Curtis Reed  Procedure(s) Performed: XI ROBOTIC ASSISTED SIMPLE PROSTATECTOMY     Patient location during evaluation: PACU Anesthesia Type: General Level of consciousness: awake and alert Pain management: pain level controlled Vital Signs Assessment: post-procedure vital signs reviewed and stable Respiratory status: spontaneous breathing, nonlabored ventilation, respiratory function stable and patient connected to nasal cannula oxygen Cardiovascular status: blood pressure returned to baseline and stable Postop Assessment: no apparent nausea or vomiting Anesthetic complications: no   No notable events documented.  Last Vitals:  Vitals:   01/25/21 0955 01/25/21 1500  BP: (!) 156/95 (!) 162/96  Pulse: 84 79  Resp: 16 12  Temp: 36.9 C 36.7 C  SpO2: 100% 100%    Last Pain:  Vitals:   01/25/21 1500  TempSrc:   PainSc: 0-No pain                 Bralin Garry

## 2021-01-25 NOTE — Transfer of Care (Signed)
Immediate Anesthesia Transfer of Care Note  Patient: Curtis Reed  Procedure(s) Performed: XI ROBOTIC ASSISTED SIMPLE PROSTATECTOMY  Patient Location: PACU  Anesthesia Type:General  Level of Consciousness: awake, alert  and patient cooperative  Airway & Oxygen Therapy: Patient Spontanous Breathing and Patient connected to face mask oxygen  Post-op Assessment: Report given to RN and Post -op Vital signs reviewed and stable  Post vital signs: Reviewed and stable  Last Vitals:  Vitals Value Taken Time  BP    Temp    Pulse 79 01/25/21 1459  Resp 12 01/25/21 1459  SpO2 100 % 01/25/21 1459  Vitals shown include unvalidated device data.  Last Pain:  Vitals:   01/25/21 1009  TempSrc:   PainSc: 0-No pain         Complications: No notable events documented.

## 2021-01-26 ENCOUNTER — Encounter (HOSPITAL_COMMUNITY): Payer: Self-pay | Admitting: Urology

## 2021-01-26 LAB — BASIC METABOLIC PANEL
Anion gap: 6 (ref 5–15)
BUN: 10 mg/dL (ref 8–23)
CO2: 24 mmol/L (ref 22–32)
Calcium: 8.2 mg/dL — ABNORMAL LOW (ref 8.9–10.3)
Chloride: 103 mmol/L (ref 98–111)
Creatinine, Ser: 0.97 mg/dL (ref 0.61–1.24)
GFR, Estimated: >60 mL/min (ref >60)
Glucose, Bld: 150 mg/dL — ABNORMAL HIGH (ref 70–99)
Potassium: 3.9 mmol/L (ref 3.5–5.1)
Sodium: 133 mmol/L — ABNORMAL LOW (ref 135–145)

## 2021-01-26 LAB — CREATININE, FLUID (PLEURAL, PERITONEAL, JP DRAINAGE): Creat, Fluid: 0.9 mg/dL

## 2021-01-26 LAB — HEMOGLOBIN AND HEMATOCRIT, BLOOD
HCT: 36.5 % — ABNORMAL LOW (ref 39.0–52.0)
Hemoglobin: 12.1 g/dL — ABNORMAL LOW (ref 13.0–17.0)

## 2021-01-26 LAB — SURGICAL PATHOLOGY

## 2021-01-26 NOTE — Discharge Summary (Addendum)
Date of admission: 01/25/2021  Date of discharge: 01/27/2021  Admission diagnosis: BPH, urinary retention   Discharge diagnosis: BPH, urinary retention   Secondary diagnoses:  Patient Active Problem List   Diagnosis Date Noted   BPH with obstruction/lower urinary tract symptoms 01/25/2021   Uncontrolled stage 2 hypertension 08/20/2016    Procedures performed: Procedure(s): XI ROBOTIC ASSISTED SIMPLE PROSTATECTOMY  History and Physical: For full details, please see admission history and physical. Briefly, Curtis Reed is a 65 y.o. year old patient with 280g prostate with catheter dependent urinary retention.   Hospital Course: Patient tolerated the procedure well.  He was then transferred to the floor after an uneventful PACU stay.  His hospital course was uncomplicated.  His urine was light pink post operatively so he was not placed on CBI. He tolerated clears without nausea or vomiting so was advanced to a regular diet. He was ambulating independently and pain was well controlled on just oral medications. JP creatinine was negative so JP drain was removed prior to discharge. Patient remained admitted until POD2 for additional assurance that he would be comfortable caring for himself at home. On POD#2 he had met discharge criteria: was eating a regular diet, was up and ambulating independently,  pain was well controlled, catheter was draining well, and was ready to for discharge.   Laboratory values:  Recent Labs    01/25/21 1808 01/26/21 0442  HGB 13.9 12.1*  HCT 42.2 36.5*   Recent Labs    01/26/21 0442  NA 133*  K 3.9  CL 103  CO2 24  GLUCOSE 150*  BUN 10  CREATININE 0.97  CALCIUM 8.2*   No results for input(s): LABPT, INR in the last 72 hours. No results for input(s): LABURIN in the last 72 hours. Results for orders placed or performed in visit on 01/23/21  SARS Coronavirus 2 (TAT 6-24 hrs)     Status: None   Collection Time: 01/23/21 12:00 AM  Result Value Ref  Range Status   SARS Coronavirus 2 RESULT: NEGATIVE  Final    Comment: RESULT: NEGATIVESARS-CoV-2 INTERPRETATION:A NEGATIVE  test result means that SARS-CoV-2 RNA was not present in the specimen above the limit of detection of this test. This does not preclude a possible SARS-CoV-2 infection and should not be used as the  sole basis for patient management decisions. Negative results must be combined with clinical observations, patient history, and epidemiological information. Optimum specimen types and timing for peak viral levels during infections caused by SARS-CoV-2  have not been determined. Collection of multiple specimens or types of specimens may be necessary to detect virus. Improper specimen collection and handling, sequence variability under primers/probes, or organism present below the limit of detection may  lead to false negative results. Positive and negative predictive values of testing are highly dependent on prevalence. False negative test results are more likely when prevalence of disease is high.The expected result is NEGATIVE.Fact S heet for  Healthcare Providers: LocalChronicle.no Sheet for Patients: SalonLookup.es Reference Range - Negative     Disposition: Home  Discharge instruction: The patient was instructed to be ambulatory but told to refrain from heavy lifting, strenuous activity, or driving.  Discharge medications:  Allergies as of 01/27/2021   No Known Allergies      Medication List     STOP taking these medications    amLODipine 5 MG tablet Commonly known as: NORVASC   atorvastatin 20 MG tablet Commonly known as: LIPITOR   PARoxetine 10 MG tablet Commonly known as:  Paxil       TAKE these medications    cephALEXin 500 MG capsule Commonly known as: KEFLEX Take 1 capsule (500 mg total) by mouth 3 (three) times daily. Start day prior to office follow up appt for foley removal What  changed: additional instructions   docusate sodium 100 MG capsule Commonly known as: COLACE Take 1 capsule (100 mg total) by mouth 2 (two) times daily.   finasteride 5 MG tablet Commonly known as: PROSCAR Take 5 mg by mouth daily.   HYDROcodone-acetaminophen 5-325 MG tablet Commonly known as: Norco Take 1-2 tablets by mouth every 6 (six) hours as needed.        Followup:   Follow-up Information     Alexis Frock, MD Follow up on 02/07/2021.   Specialty: Urology Why: at 8:30 Contact information: [place] [address] [phone]

## 2021-01-26 NOTE — Plan of Care (Signed)
  Problem: Education: Goal: Knowledge of General Education information will improve Description: Including pain rating scale, medication(s)/side effects and non-pharmacologic comfort measures Outcome: Progressing   Problem: Activity: Goal: Risk for activity intolerance will decrease Outcome: Progressing   Problem: Coping: Goal: Level of anxiety will decrease Outcome: Progressing   

## 2021-01-26 NOTE — Progress Notes (Signed)
Pt ambulated in hallway 236 ft. Tolerated well

## 2021-01-26 NOTE — Op Note (Signed)
NAMEHAMEED, KOLAR MEDICAL RECORD NO: [id number] ACCOUNT NO: [id number] DATE OF BIRTH: [birth year] FACILITY: [facility] LOCATION: [id number] PHYSICIAN: Sebastian Ache, MD  Operative Report   DATE OF PROCEDURE: 01/25/2021  PREOPERATIVE DIAGNOSIS:  Massive prostate with urinary retention.  PROCEDURE:  Robotic-assisted laparoscopic simple prostatectomy.  ESTIMATED BLOOD LOSS:  500 mL.  COMPLICATIONS:  None.  SPECIMEN:  Prostate adenoma for pathology.  ASSISTANT:  Pattricia Boss, PA  DRAINS:    1.  Jackson-Pratt drain to bulb suction. 2.  Foley catheter to straight drain.  FINDINGS:  Massive prostate adenoma.  INDICATIONS:  The patient is a pleasant 65 year old man with progressive obstructive voiding for some time. He developed frank urinary retention and has been on appropriate medical therapy for some time. Despite medical therapy, he has failed trial of  void times several.  His prostate is quite large, over 200 grams. Further options were discussed with goal of catheter free including recommended path of simple prostatectomy being most definitive given his prostate size.  He wished to proceed.  Informed  consent was obtained and placed in medical record.  DESCRIPTION OF PROCEDURE:  The patient being Curtis Reed verified.  Procedure being simple prostatectomy was confirmed.  Procedure timeout was performed.  Intravenous antibiotics were administered.  General endotracheal anesthesia was induced.  The  patient was placed into a low lithotomy position.  Sterile field was created, prepped and draped the patient's penis, perineum, and proximal thigh using iodine and his infraxiphoid abdomen using chlorhexidine gluconate after clipper shaving after he was  further fastened to the operating table using 3-inch tape over foam padding across the supraxiphoid chest.  His arms were tucked to the side with gel rolls.  A test of steep Trendelenburg positioning was performed and was found to be suitably  positioned.   Foley catheter was placed per urethra for straight drain.  Next, a high flow, low pressure pneumoperitoneum was obtained using Veress technique in the supraumbilical midline having passed the aspiration and drop test.  An 8 mm robotic camera port was  then placed in the same location.  Laparoscopic examination of peritoneal cavity revealed no significant adhesions, no visceral injury.  Additional ports were placed as follows:  Right paramedian 8 mm robotic port, right far lateral 12 mm AirSeal  assistant port, right paramedian 5 mm suction port, left paramedian 8 mm robotic port, left far lateral 8 mm robotic port.  The robot was docked and passed the electronic checks.  Next, attention was directed at development of space of Retzius.  Incision  was made lateral to the right medial umbilical ligament from midline towards the area of the internal ring coursing towards the iliac vessels.  The right vas deferens was encountered and visualized as were the right iliac vessels, and these structures  were left in situ and the lateral bladder wall swept away from pelvic sidewall towards the area of the endopelvic fascia on the right side.  A mirror image dissection was performed on the left side.  Anterior attachment was taken down with cautery scissors to expose  the anterior base of the prostate.  Prostate was quite massive as anticipated. Fibrofatty tissue was swept away from the aspect of the dorsal venous complex between the puboprostatic ligaments bilaterally, thus exposing the dorsal venous complex, which  was controlled using green load stapler taking exquisite care to avoid membranous urethral injury, which did occur.  Next, an inverted U-shaped cystotomy was made and positioned approximately 2 cm proximal to the true bladder  neck for approximately 60%  circumference of the diameter of the bladder neck.  The ureteral orifices bilaterally were visualized and kept away from the plane of  dissection.  This exposed the very large prostate adenoma.  There was some asymmetry with the right lobe being greater  than the left and some smaller median lobe component.  Next, 0 Vicryl stay sutures x5 were placed; 2 in the right lobe, 2 in the left lobe, 1 in the posterior aspect of the median lobe for adenoma retraction.  The adenoma was placed on superior anterior  traction and the posterior adenoma plane was entered keeping several-centimeter mucosal flap with the bladder plane.  The adenoma plane was entered.  There was some moderate desmoplasia as anticipated given his prolonged catheterization.  This was  carried down all the way to the presumed area of the apex of the prostate.  This was carried out laterally on both sides posteriorly.  Next, the adenoma was retracted towards the right, and the left lateral plane was dissected in a bottom up type fashion  towards the area of the 12 o'clock position, and similarly the adenoma was retracted to the left, and the right plane was developed laterally from the inferior aspect coursing anteriorly towards the area of 12 o'clock plane. The entire adenoma was then  placed on superior traction and was butterflied to the 12 o'clock position by incising the anterior commissure of the adenoma directly towards the area of the apex of the prostate.  This allowed much better visualization of the apex, which was then  released.  This completely freed up the large adenoma.  Specimen was placed in EndoCatch bag for later retrieval.  Inspection of the prostatic fossa did reveal some adenoma residual tissue on the right posterior apical region, and additional resection of  this area was performed and set aside additionally with the adenoma specimen.  Following this, there was excellent removal of all adenoma tissue and excellent conical geometry of the prostatic fossa.  No evidence of any capsular violation.  Digital  rectal exam was performed using an indicator glove.  No evidence of rectal violation was noted.  A 3-0 V-Loc suture was used to oversew several small venous sinuses posteriorly and several small pedicle vessels on the left side.  A second 3-0 V-Loc was  used in a Rocco type fashion to bring the bladder neck mucosa and posterior urethra in closer orientation and reduce tension, and finally double-armed 3-0 V-Loc suture was used to create a 50% posterior circumferential mucosal anastomosis bringing the  bladder mucosa posteriorly directly in apposition of the posterior 50% circumference of the urethra.  This resulted in excellent hemostasis and a great mucosal bridge running from the urethra all the way to the bladder.  Hemostasis was excellent.  I was  quite happy with that.  Sponge and needle counts were correct.  The remaining cystotomy was then closed using 2 running suture lines of 2-0 V-Loc meeting at 12 o'clock position.  A new 3-way Foley catheter was placed per urethra, 20 mL were in the  balloon.  This was irrigated quantitatively.  A closed suction drain was brought out through the previous left most lateral port site into the area of the peritoneal cavity.  The robot was undocked.  The previous right lateral most assistant port site  was closed at the level of the fascia using Carter-Thomason suture passer and 0 Vicryl.  The specimen was retrieved by extending the previous camera port site  superiorly and inferiorly for a total distance of approximately 4 cm removing the large adenoma  specimen setting aside for pathology. The extraction site was closed at the level of fascia using figure-of-eight PDS x4 followed by reapproximation of Scarpa's with running Vicryl.  All incision sites were infiltrated with dilute lipolyzed Marcaine and  closed at the level of skin using subcuticular Monocryl followed by Dermabond.  The procedure was then terminated.  The patient tolerated the procedure well.  No immediate perioperative complications.  The patient was  taken to postanesthesia care unit  in stable condition.  Plan for observation admission, likely discharge home tomorrow versus following day with his catheter in place.  Please note, first assistant, Pattricia Boss, was crucial for all portions of the surgery today.  She provided invaluable retraction, suctioning, vascular stapling, specimen manipulation, and general first assistance.   ROH D: 01/25/2021 2:53:56 pm T: 01/25/2021 11:31:00 pm  JOB: [id number]/ [id number]

## 2021-01-27 NOTE — Plan of Care (Signed)
  Problem: Clinical Measurements: Goal: Ability to maintain clinical measurements within normal limits will improve Outcome: Progressing   Problem: Clinical Measurements: Goal: Will remain free from infection Outcome: Progressing   Problem: Clinical Measurements: Goal: Diagnostic test results will improve Outcome: Progressing   

## 2021-01-27 NOTE — TOC Initial Note (Signed)
Transition of Care [facility]) - Initial/Assessment Note    Patient Details  Name: Curtis Reed MRN: [id number] Date of Birth: 02/03/1956  Transition of Care [facility]) CM/SW Contact:    Golda Acre, RN Phone Number: 01/27/2021, 9:18 AM  Clinical Narrative:                  Transition of Care [facility]) Screening Note   Patient Details  Name: Curtis Reed Date of Birth: Apr 25, 1955   Transition of Care [facility]) CM/SW Contact:    Golda Acre, RN Phone Number: 01/27/2021, 9:18 AM    Transition of Care Department [facility]) has reviewed patient and no TOC needs have been identified at this time. We will continue to monitor patient advancement through interdisciplinary progression rounds. If new patient transition needs arise, please place a TOC consult.    Expected Discharge Plan: Home/Self Care Barriers to Discharge: Continued Medical Work up   Patient Goals and CMS Choice Patient states their goals for this hospitalization and ongoing recovery are:: to go home CMS Medicare.gov Compare Post Acute Care list provided to:: Patient    Expected Discharge Plan and Services Expected Discharge Plan: Home/Self Care   Discharge Planning Services: CM Consult   Living arrangements for the past 2 months: Apartment Expected Discharge Date: 01/27/21                                    Prior Living Arrangements/Services Living arrangements for the past 2 months: Apartment Lives with:: Self Patient language and need for interpreter reviewed:: Yes Do you feel safe going back to the place where you live?: Yes            Criminal Activity/Legal Involvement Pertinent to Current Situation/Hospitalization: No - Comment as needed  Activities of Daily Living      Permission Sought/Granted                  Emotional Assessment Appearance:: Appears stated age Attitude/Demeanor/Rapport: Engaged Affect (typically observed): Calm Orientation: : Oriented to Self, Oriented to  Place, Oriented to  Time, Oriented to Situation Alcohol / Substance Use: Not Applicable Psych Involvement: No (comment)  Admission diagnosis:  BPH with obstruction/lower urinary tract symptoms [N40.1, N13.8] Patient Active Problem List   Diagnosis Date Noted   BPH with obstruction/lower urinary tract symptoms 01/25/2021   Uncontrolled stage 2 hypertension 08/20/2016   PCP:  Pcp, No Pharmacy:   [id number] [place], [place] - [address] AT [facility] [address] [place] [address] Phone: [phone] Fax: [phone]     Social Determinants of Health (SDOH) Interventions    Readmission Risk Interventions No flowsheet data found.

## 2021-01-27 NOTE — Progress Notes (Signed)
Patient discharged per order, reviewed care of leg bag til f/u appointment with MD, patient verbalizes understanding.  To lobby per wheelchair.

## 2021-01-27 NOTE — TOC Transition Note (Signed)
Transition of Care [facility]) - CM/SW Discharge Note   Patient Details  Name: Curtis Reed MRN: [id number] Date of Birth: 1955/10/28  Transition of Care [facility]) CM/SW Contact:  Golda Acre, RN Phone Number: 01/27/2021, 9:22 AM   Clinical Narrative:    Discharged to home with no needs from toc   Final next level of care: Home/Self Care Barriers to Discharge: Barriers Resolved   Patient Goals and CMS Choice Patient states their goals for this hospitalization and ongoing recovery are:: to go home CMS Medicare.gov Compare Post Acute Care list provided to:: Patient    Discharge Placement                       Discharge Plan and Services   Discharge Planning Services: CM Consult                                 Social Determinants of Health (SDOH) Interventions     Readmission Risk Interventions No flowsheet data found.

## 2022-09-05 IMAGING — CT CT RENAL STONE PROTOCOL
2 of 4 series · 16 of 46 positions shown, 18 images · non-contrast
Comparison: None.

CLINICAL DATA: Flank pain, hematuria

EXAM:
CT ABDOMEN AND PELVIS WITHOUT CONTRAST
TECHNIQUE: Multidetector CT imaging of the abdomen and pelvis was performed
following the standard protocol without IV contrast.

[Series 3: ap without · axial · non-contrast · 0.67mm/px · z∈[+919,+1324]mm · 13 of 93 slices shown, 15 images]
[im 6/93  soft-tissue]
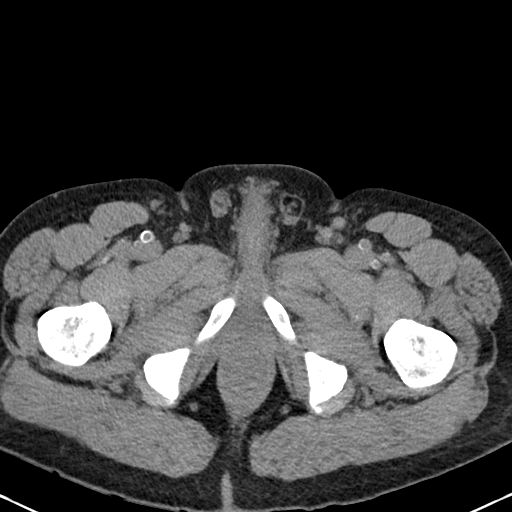
[im 6/93  bone]
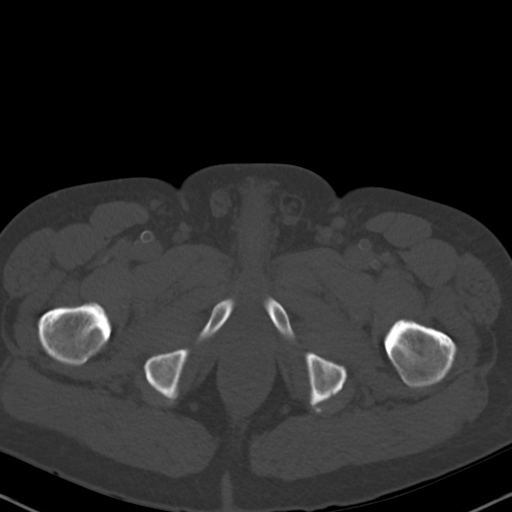
[im 11/93  soft-tissue]
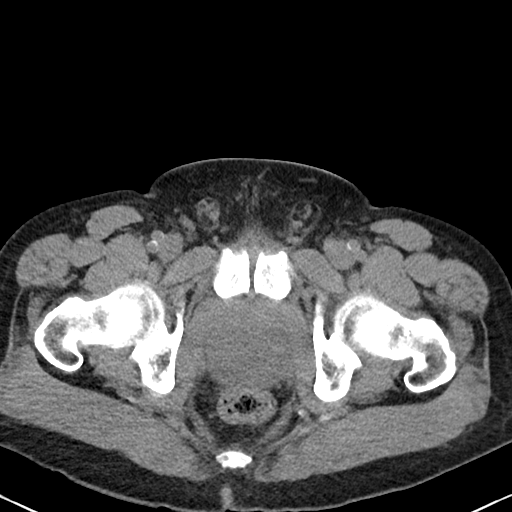
[im 22/93  soft-tissue]
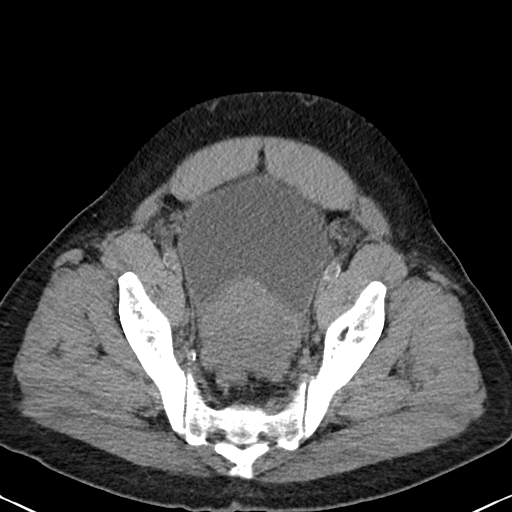
[im 28/93  soft-tissue]
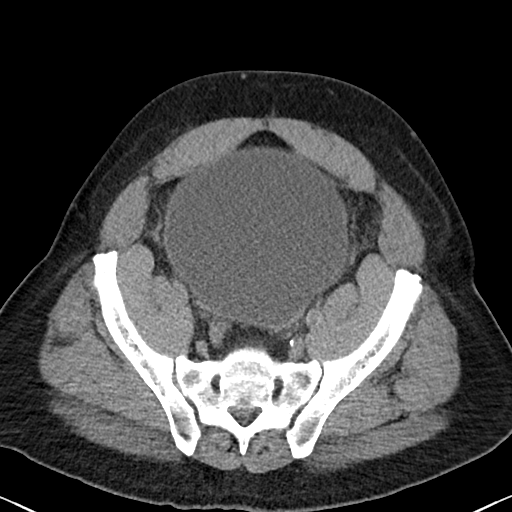
[im 33/93  soft-tissue]
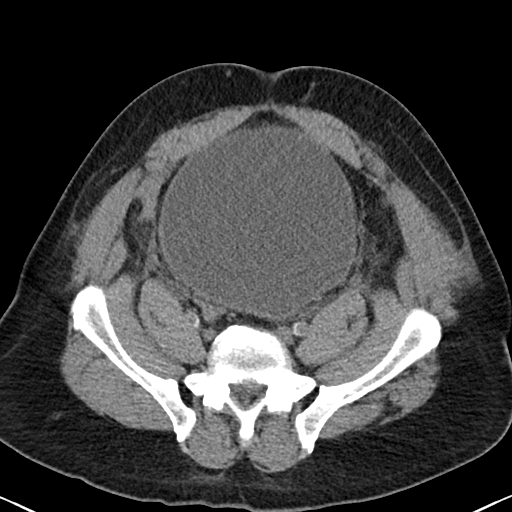
[im 38/93  soft-tissue]
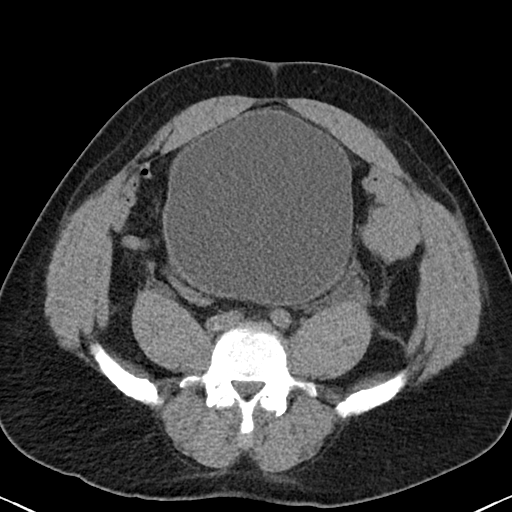
[im 49/93  soft-tissue]
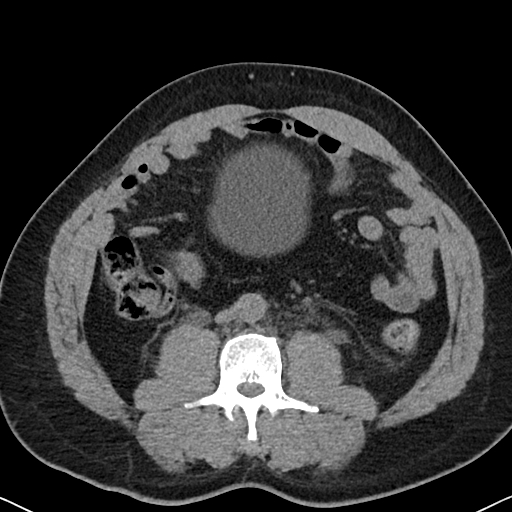
[im 55/93  soft-tissue]
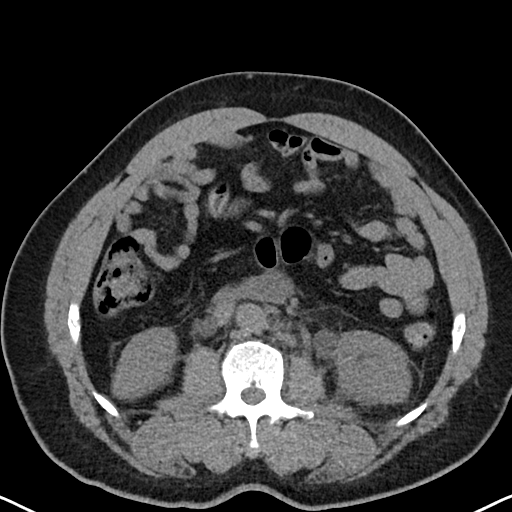
[im 60/93  soft-tissue]
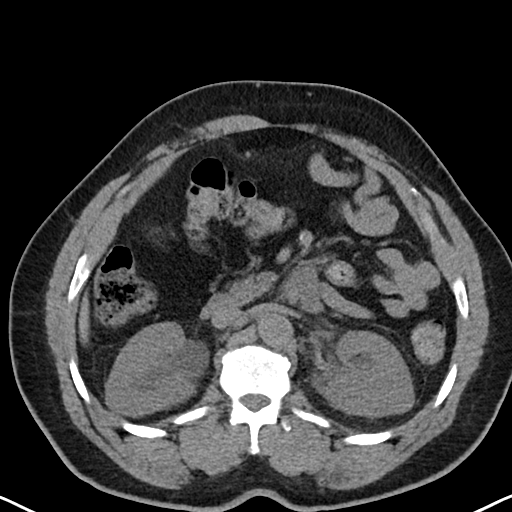
[im 60/93  bone]
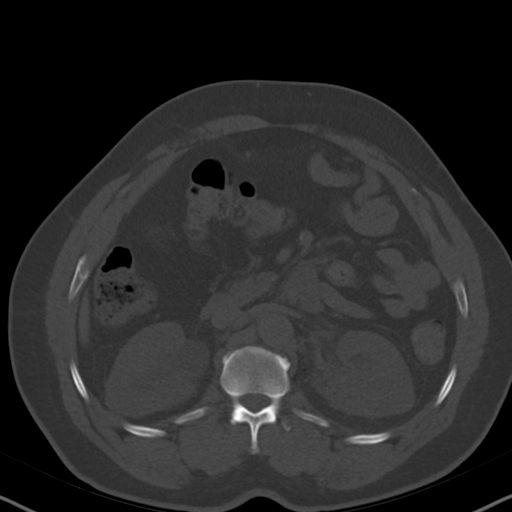
[im 65/93  soft-tissue]
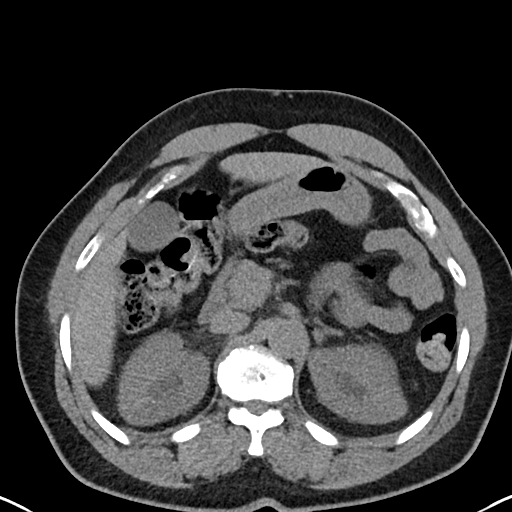
[im 71/93  soft-tissue]
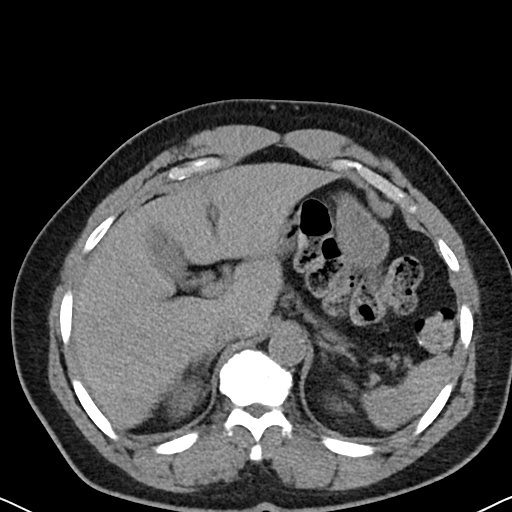
[im 82/93  soft-tissue]
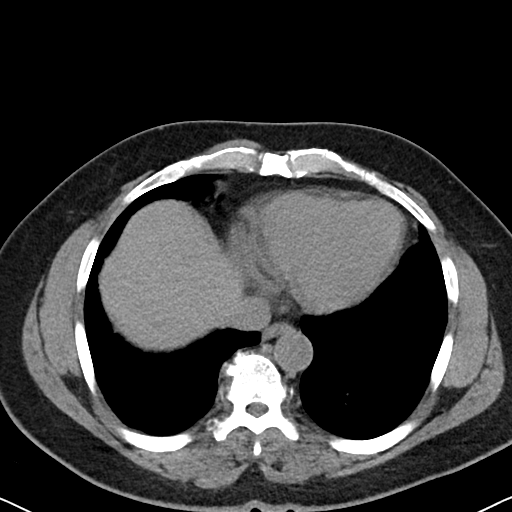
[im 87/93  soft-tissue]
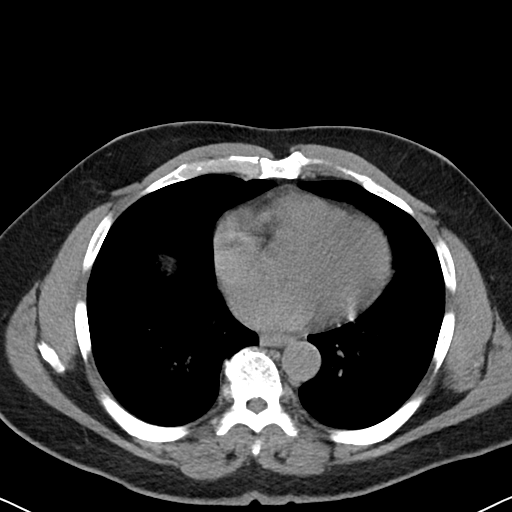

[Series 6: cor · coronal · 0.81mm/px · 3 of 105 slices shown]
[im 35/105  soft-tissue]
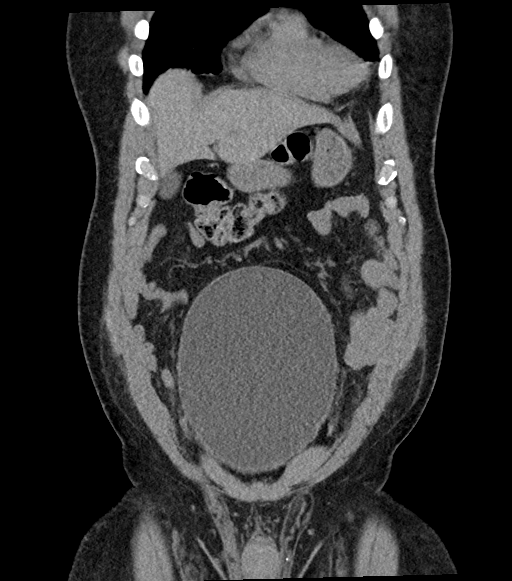
[im 47/105  soft-tissue]
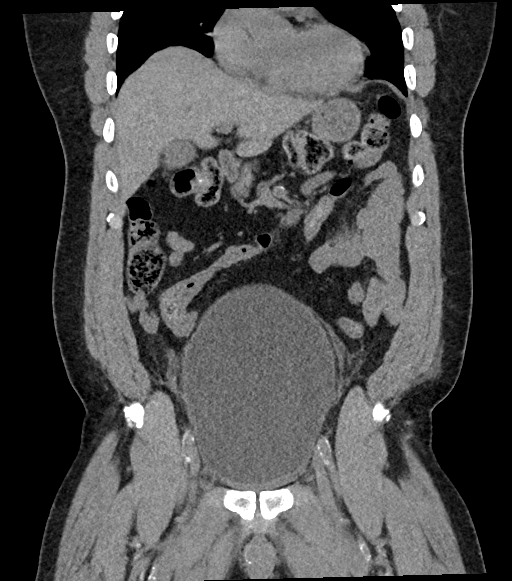
[im 58/105  soft-tissue]
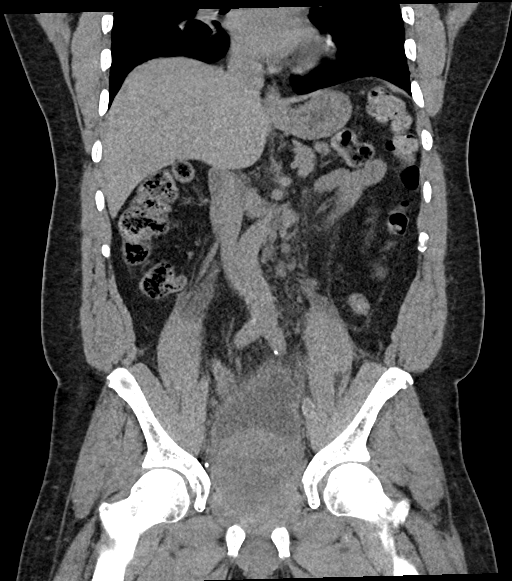

[16 of 46 positions shown; findings below may reference images not displayed]

FINDINGS: Lower chest: The lung bases are clear. The imaged heart is
unremarkable.

Hepatobiliary: The liver and gallbladder are unremarkable. There is
no biliary ductal dilatation.

Pancreas: Unremarkable.

Spleen: Unremarkable.

Adrenals/Urinary Tract: The adrenals are unremarkable.

The bladder is markedly distended due to a markedly enlarged
prostate. There is mild to moderate bilateral hydroureter and mild
hydronephrosis. A fluid density structure abutting the anterior
margin of the left psoas muscle and posterior aspect of the bladder
is favored to reflect a tortuous left ureter. No focal lesions or
calculi are seen in either kidney or along the course of either
ureter.

Stomach/Bowel: The stomach is unremarkable. There is no evidence of
bowel obstruction. There is no abnormal bowel wall thickening or
inflammatory change. The appendix is unremarkable.

Vascular/Lymphatic: There is calcified atherosclerotic plaque in the
bilateral iliac vessels. The aorta is nonaneurysmal. There is no
abdominal or pelvic lymphadenopathy.

Reproductive: The prostate is markedly enlarged measuring up to
cm TV x 7.3 cm AP by 7.3 cm cc yielding a calculated volume of 272
cc

Other: There is scattered small volume free fluid in the pericolic
gutters. There is no free intraperitoneal air.

Musculoskeletal: There is mild multilevel degenerative change of the
spine. There is no acute osseous abnormality or aggressive osseous
lesion.
IMPRESSION: Findings above consistent with bladder outlet obstruction by a
markedly enlarged prostate with resulting mild-to-moderate bilateral
hydroureter and mild hydronephrosis. No stone is identified.
